# Patient Record
Sex: Female | Born: 1990 | Race: Black or African American | Hispanic: No | Marital: Single | Smoking: Never smoker
Health system: Southern US, Community
[De-identification: ages and names within clinical notes are randomized; demographics above are authoritative.]

## PROBLEM LIST (undated history)

## (undated) DIAGNOSIS — F419 Anxiety disorder, unspecified: Secondary | ICD-10-CM

## (undated) DIAGNOSIS — E039 Hypothyroidism, unspecified: Secondary | ICD-10-CM

## (undated) DIAGNOSIS — Z789 Other specified health status: Secondary | ICD-10-CM

## (undated) DIAGNOSIS — Z9081 Acquired absence of spleen: Secondary | ICD-10-CM

## (undated) DIAGNOSIS — F32A Depression, unspecified: Secondary | ICD-10-CM

## (undated) HISTORY — PX: SPLENECTOMY: SUR1306

---

## 2015-09-08 ENCOUNTER — Ambulatory Visit (INDEPENDENT_AMBULATORY_CARE_PROVIDER_SITE_OTHER): Payer: BC Managed Care – PPO | Admitting: *Deleted

## 2015-09-08 DIAGNOSIS — Z3201 Encounter for pregnancy test, result positive: Secondary | ICD-10-CM

## 2015-09-08 NOTE — Progress Notes (Signed)
Pt in for pregnancy test. Result is positive. Patient has certain lmp of 08/06/15. She desires to establish care with us. Prenatal labs drawn and first trimester screen scheduled.

## 2015-09-09 LAB — PRENATAL PROFILE (SOLSTAS)
Antibody Screen: NEGATIVE
BASOS PCT: 0 % (ref 0–1)
Basophils Absolute: 0 10*3/uL (ref 0.0–0.1)
EOS ABS: 0.1 10*3/uL (ref 0.0–0.7)
EOS PCT: 1 % (ref 0–5)
HCT: 36 % (ref 36.0–46.0)
HEMOGLOBIN: 12.5 g/dL (ref 12.0–15.0)
HEP B S AG: NEGATIVE
HIV 1&2 Ab, 4th Generation: NONREACTIVE
Lymphocytes Relative: 24 % (ref 12–46)
Lymphs Abs: 2.3 10*3/uL (ref 0.7–4.0)
MCH: 30.3 pg (ref 26.0–34.0)
MCHC: 34.7 g/dL (ref 30.0–36.0)
MCV: 87.4 fL (ref 78.0–100.0)
MONO ABS: 0.6 10*3/uL (ref 0.1–1.0)
MONOS PCT: 6 % (ref 3–12)
MPV: 9.7 fL (ref 8.6–12.4)
NEUTROS ABS: 6.6 10*3/uL (ref 1.7–7.7)
Neutrophils Relative %: 69 % (ref 43–77)
Platelets: 518 10*3/uL — ABNORMAL HIGH (ref 150–400)
RBC: 4.12 MIL/uL (ref 3.87–5.11)
RDW: 14.7 % (ref 11.5–15.5)
RH TYPE: POSITIVE
RUBELLA: 6.34 {index} — AB (ref <0.90)
WBC: 9.5 10*3/uL (ref 4.0–10.5)

## 2015-09-09 LAB — PRESCRIPTION MONITORING PROFILE (19 PANEL)
Amphetamine/Meth: NEGATIVE ng/mL
BARBITURATE SCREEN, URINE: NEGATIVE ng/mL
BENZODIAZEPINE SCREEN, URINE: NEGATIVE ng/mL
BUPRENORPHINE, URINE: NEGATIVE ng/mL
COCAINE METABOLITES: NEGATIVE ng/mL
CREATININE, URINE: 162.84 mg/dL (ref >20.0)
Cannabinoid Scrn, Ur: NEGATIVE ng/mL
Carisoprodol, Urine: NEGATIVE ng/mL
ECSTASY: NEGATIVE ng/mL
FENTANYL URINE: NEGATIVE ng/mL
METHAQUALONE SCREEN (URINE): NEGATIVE ng/mL
Meperidine, Ur: NEGATIVE ng/mL
Methadone Screen, Urine: NEGATIVE ng/mL
Nitrites, Initial: NEGATIVE ug/mL
OPIATE SCREEN, URINE: NEGATIVE ng/mL
Oxycodone Screen, Ur: NEGATIVE ng/mL
PH URINE, INITIAL: 6.8 pH (ref 4.5–8.9)
PHENCYCLIDINE, UR: NEGATIVE ng/mL
Propoxyphene: NEGATIVE ng/mL
Tapentadol, urine: NEGATIVE ng/mL
Tramadol Scrn, Ur: NEGATIVE ng/mL
ZOLPIDEM, URINE: NEGATIVE ng/mL

## 2015-09-09 LAB — CULTURE, OB URINE
COLONY COUNT: NO GROWTH
ORGANISM ID, BACTERIA: NO GROWTH

## 2015-10-26 ENCOUNTER — Encounter (HOSPITAL_COMMUNITY): Payer: Self-pay | Admitting: Family Medicine

## 2015-11-02 ENCOUNTER — Ambulatory Visit (HOSPITAL_COMMUNITY)
Admission: RE | Admit: 2015-11-02 | Discharge: 2015-11-02 | Disposition: A | Discharge status: Home / Self Care | Payer: BC Managed Care – PPO | Source: Ambulatory Visit | Attending: Family Medicine | Admitting: Family Medicine

## 2015-11-02 ENCOUNTER — Other Ambulatory Visit: Payer: Self-pay | Admitting: Family Medicine

## 2015-11-02 ENCOUNTER — Encounter (HOSPITAL_COMMUNITY): Payer: Self-pay

## 2015-11-02 ENCOUNTER — Ambulatory Visit (INDEPENDENT_AMBULATORY_CARE_PROVIDER_SITE_OTHER): Payer: BC Managed Care – PPO | Admitting: Advanced Practice Midwife

## 2015-11-02 ENCOUNTER — Encounter: Payer: Self-pay | Admitting: Family Medicine

## 2015-11-02 VITALS — BP 124/76 | HR 89 | Temp 98.4°F | Ht 63.5 in | Wt 273.7 lb

## 2015-11-02 VITALS — BP 119/58 | HR 91 | Wt 274.0 lb

## 2015-11-02 DIAGNOSIS — Z124 Encounter for screening for malignant neoplasm of cervix: Secondary | ICD-10-CM | POA: Diagnosis not present

## 2015-11-02 DIAGNOSIS — Z113 Encounter for screening for infections with a predominantly sexual mode of transmission: Secondary | ICD-10-CM

## 2015-11-02 DIAGNOSIS — E669 Obesity, unspecified: Secondary | ICD-10-CM | POA: Insufficient documentation

## 2015-11-02 DIAGNOSIS — O99211 Obesity complicating pregnancy, first trimester: Secondary | ICD-10-CM

## 2015-11-02 DIAGNOSIS — Z369 Encounter for antenatal screening, unspecified: Secondary | ICD-10-CM

## 2015-11-02 DIAGNOSIS — Z3A12 12 weeks gestation of pregnancy: Secondary | ICD-10-CM | POA: Insufficient documentation

## 2015-11-02 DIAGNOSIS — Z3401 Encounter for supervision of normal first pregnancy, first trimester: Secondary | ICD-10-CM

## 2015-11-02 DIAGNOSIS — Z3402 Encounter for supervision of normal first pregnancy, second trimester: Secondary | ICD-10-CM | POA: Diagnosis not present

## 2015-11-02 DIAGNOSIS — Z3201 Encounter for pregnancy test, result positive: Secondary | ICD-10-CM

## 2015-11-02 DIAGNOSIS — Z3492 Encounter for supervision of normal pregnancy, unspecified, second trimester: Secondary | ICD-10-CM

## 2015-11-02 DIAGNOSIS — Z3490 Encounter for supervision of normal pregnancy, unspecified, unspecified trimester: Secondary | ICD-10-CM

## 2015-11-02 DIAGNOSIS — Z36 Encounter for antenatal screening of mother: Secondary | ICD-10-CM | POA: Diagnosis present

## 2015-11-02 DIAGNOSIS — N390 Urinary tract infection, site not specified: Secondary | ICD-10-CM | POA: Insufficient documentation

## 2015-11-02 HISTORY — DX: Other specified health status: Z78.9

## 2015-11-02 LAB — POCT URINALYSIS DIP (DEVICE)
BILIRUBIN URINE: NEGATIVE
GLUCOSE, UA: NEGATIVE mg/dL
HGB URINE DIPSTICK: NEGATIVE
Ketones, ur: NEGATIVE mg/dL
NITRITE: POSITIVE — AB
Protein, ur: NEGATIVE mg/dL
Specific Gravity, Urine: <=1.005 (ref 1.005–1.030)
UROBILINOGEN UA: 0.2 mg/dL (ref 0.0–1.0)
pH: 5.5 (ref 5.0–8.0)

## 2015-11-02 NOTE — Patient Instructions (Signed)
First Trimester of Pregnancy The first trimester of pregnancy is from week 1 until the end of week 12 (months 1 through 3). A week after a sperm fertilizes an egg, the egg will implant on the wall of the uterus. This embryo will begin to develop into a baby. Genes from you and your partner are forming the baby. The female genes determine whether the baby is a boy or a girl. At 6-8 weeks, the eyes and face are formed, and the heartbeat can be seen on ultrasound. At the end of 12 weeks, all the baby's organs are formed.  Now that you are pregnant, you will want to do everything you can to have a healthy baby. Two of the most important things are to get good prenatal care and to follow your health care provider's instructions. Prenatal care is all the medical care you receive before the baby's birth. This care will help prevent, find, and treat any problems during the pregnancy and childbirth. BODY CHANGES Your body goes through many changes during pregnancy. The changes vary from woman to woman.   You may gain or lose a couple of pounds at first.  You may feel sick to your stomach (nauseous) and throw up (vomit). If the vomiting is uncontrollable, call your health care provider.  You may tire easily.  You may develop headaches that can be relieved by medicines approved by your health care provider.  You may urinate more often. Painful urination may mean you have a bladder infection.  You may develop heartburn as a result of your pregnancy.  You may develop constipation because certain hormones are causing the muscles that push waste through your intestines to slow down.  You may develop hemorrhoids or swollen, bulging veins (varicose veins).  Your breasts may begin to grow larger and become tender. Your nipples may stick out more, and the tissue that surrounds them (areola) may become darker.  Your gums may bleed and may be sensitive to brushing and flossing.  Dark spots or blotches (chloasma,  mask of pregnancy) may develop on your face. This will likely fade after the baby is born.  Your menstrual periods will stop.  You may have a loss of appetite.  You may develop cravings for certain kinds of food.  You may have changes in your emotions from day to day, such as being excited to be pregnant or being concerned that something may go wrong with the pregnancy and baby.  You may have more vivid and strange dreams.  You may have changes in your hair. These can include thickening of your hair, rapid growth, and changes in texture. Some women also have hair loss during or after pregnancy, or hair that feels dry or thin. Your hair will most likely return to normal after your baby is born. WHAT TO EXPECT AT YOUR PRENATAL VISITS During a routine prenatal visit:  You will be weighed to make sure you and the baby are growing normally.  Your blood pressure will be taken.  Your abdomen will be measured to track your baby's growth.  The fetal heartbeat will be listened to starting around week 10 or 12 of your pregnancy.  Test results from any previous visits will be discussed. Your health care provider may ask you:  How you are feeling.  If you are feeling the baby move.  If you have had any abnormal symptoms, such as leaking fluid, bleeding, severe headaches, or abdominal cramping.  If you are using any tobacco products,   including cigarettes, chewing tobacco, and electronic cigarettes.  If you have any questions. Other tests that may be performed during your first trimester include:  Blood tests to find your blood type and to check for the presence of any previous infections. They will also be used to check for low iron levels (anemia) and Rh antibodies. Later in the pregnancy, blood tests for diabetes will be done along with other tests if problems develop.  Urine tests to check for infections, diabetes, or protein in the urine.  An ultrasound to confirm the proper growth  and development of the baby.  An amniocentesis to check for possible genetic problems.  Fetal screens for spina bifida and Down syndrome.  You may need other tests to make sure you and the baby are doing well.  HIV (human immunodeficiency virus) testing. Routine prenatal testing includes screening for HIV, unless you choose not to have this test. HOME CARE INSTRUCTIONS  Medicines  Follow your health care provider's instructions regarding medicine use. Specific medicines may be either safe or unsafe to take during pregnancy.  Take your prenatal vitamins as directed.  If you develop constipation, try taking a stool softener if your health care provider approves. Diet  Eat regular, well-balanced meals. Choose a variety of foods, such as meat or vegetable-based protein, fish, milk and low-fat dairy products, vegetables, fruits, and whole grain breads and cereals. Your health care provider will help you determine the amount of weight gain that is right for you.  Avoid raw meat and uncooked cheese. These carry germs that can cause birth defects in the baby.  Eating four or five small meals rather than three large meals a day may help relieve nausea and vomiting. If you start to feel nauseous, eating a few soda crackers can be helpful. Drinking liquids between meals instead of during meals also seems to help nausea and vomiting.  If you develop constipation, eat more high-fiber foods, such as fresh vegetables or fruit and whole grains. Drink enough fluids to keep your urine clear or pale yellow. Activity and Exercise  Exercise only as directed by your health care provider. Exercising will help you:  Control your weight.  Stay in shape.  Be prepared for labor and delivery.  Experiencing pain or cramping in the lower abdomen or low back is a good sign that you should stop exercising. Check with your health care provider before continuing normal exercises.  Try to avoid standing for long  periods of time. Move your legs often if you must stand in one place for a long time.  Avoid heavy lifting.  Wear low-heeled shoes, and practice good posture.  You may continue to have sex unless your health care provider directs you otherwise. Relief of Pain or Discomfort  Wear a good support bra for breast tenderness.   Take warm sitz baths to soothe any pain or discomfort caused by hemorrhoids. Use hemorrhoid cream if your health care provider approves.   Rest with your legs elevated if you have leg cramps or low back pain.  If you develop varicose veins in your legs, wear support hose. Elevate your feet for 15 minutes, 3-4 times a day. Limit salt in your diet. Prenatal Care  Schedule your prenatal visits by the twelfth week of pregnancy. They are usually scheduled monthly at first, then more often in the last 2 months before delivery.  Write down your questions. Take them to your prenatal visits.  Keep all your prenatal visits as directed by your   health care provider. Safety  Wear your seat belt at all times when driving.  Make a list of emergency phone numbers, including numbers for family, friends, the hospital, and police and fire departments. General Tips  Ask your health care provider for a referral to a local prenatal education class. Begin classes no later than at the beginning of month 6 of your pregnancy.  Ask for help if you have counseling or nutritional needs during pregnancy. Your health care provider can offer advice or refer you to specialists for help with various needs.  Do not use hot tubs, steam rooms, or saunas.  Do not douche or use tampons or scented sanitary pads.  Do not cross your legs for long periods of time.  Avoid cat litter boxes and soil used by cats. These carry germs that can cause birth defects in the baby and possibly loss of the fetus by miscarriage or stillbirth.  Avoid all smoking, herbs, alcohol, and medicines not prescribed by  your health care provider. Chemicals in these affect the formation and growth of the baby.  Do not use any tobacco products, including cigarettes, chewing tobacco, and electronic cigarettes. If you need help quitting, ask your health care provider. You may receive counseling support and other resources to help you quit.  Schedule a dentist appointment. At home, brush your teeth with a soft toothbrush and be gentle when you floss. SEEK MEDICAL CARE IF:   You have dizziness.  You have mild pelvic cramps, pelvic pressure, or nagging pain in the abdominal area.  You have persistent nausea, vomiting, or diarrhea.  You have a bad smelling vaginal discharge.  You have pain with urination.  You notice increased swelling in your face, hands, legs, or ankles. SEEK IMMEDIATE MEDICAL CARE IF:   You have a fever.  You are leaking fluid from your vagina.  You have spotting or bleeding from your vagina.  You have severe abdominal cramping or pain.  You have rapid weight gain or loss.  You vomit blood or material that looks like coffee grounds.  You are exposed to German measles and have never had them.  You are exposed to fifth disease or chickenpox.  You develop a severe headache.  You have shortness of breath.  You have any kind of trauma, such as from a fall or a car accident.   This information is not intended to replace advice given to you by your health care provider. Make sure you discuss any questions you have with your health care provider.   Document Released: 08/14/2001 Document Revised: 09/10/2014 Document Reviewed: 06/30/2013 Elsevier Interactive Patient Education 2016 [employer] Inc[employer]  

## 2015-11-02 NOTE — Progress Notes (Signed)
Pain- hip pain  New ob packet given First Trimester Screen scheduled for 11/03/15 Early glucola will be scheduled for 11/04/15 @ 0800 due to pt allergic to orange and will need to do jelly bean test UA results + nitrate

## 2015-11-02 NOTE — Progress Notes (Signed)
New OB, See SmartSet  Subjective:    Joann Pearson is a G1P0 [redacted]w[redacted]d being seen today for her first obstetrical visit.  Her obstetrical history is significant for obesity. Patient does intend to breast feed. Pregnancy history fully reviewed.  Patient reports no complaints.  Filed Vitals:   11/02/15 0938 11/02/15 0940  BP: 124/76   Pulse: 89   Temp: 98.4 F (36.9 C)   Height:  5' 3.5" (1.613 m)  Weight: 273 lb 11.2 oz (124.15 kg)     HISTORY: OB History  Gravida Para Term Preterm AB SAB TAB Ectopic Multiple Living  1             # Outcome Date GA Lbr Len/2nd Weight Sex Delivery Anes PTL Lv  1 Current              History reviewed. No pertinent past medical history. History reviewed. No pertinent past surgical history. History reviewed. No pertinent family history.   Exam    Uterus:  Fundal Height: 12 cm  Pelvic Exam:    Perineum: No Hemorrhoids, Normal Perineum   Vulva: Bartholin's, Urethra, Skene's normal   Vagina:  normal mucosa, normal discharge   pH:    Cervix: no bleeding following Pap, no cervical motion tenderness and Diffucult to visualize, unsure if I got into endocervix   Adnexa: normal adnexa and no mass, fullness, tenderness   Bony Pelvis: gynecoid  System: Breast:  normal appearance, no masses or tenderness   Skin: normal coloration and turgor, no rashes    Neurologic: oriented, grossly non-focal   Extremities: normal strength, tone, and muscle mass   HEENT neck supple with midline trachea   Mouth/Teeth mucous membranes moist, pharynx normal without lesions   Neck supple and no masses   Cardiovascular: regular rate and rhythm, no murmurs or gallops   Respiratory:  appears well, vitals normal, no respiratory distress, acyanotic, normal RR, ear and throat exam is normal, neck free of mass or lymphadenopathy, chest clear, no wheezing, crepitations, rhonchi, normal symmetric air entry   Abdomen: soft, non-tender; bowel sounds normal; no masses,  no  organomegaly   Urinary: urethral meatus normal      Assessment:    Pregnancy: G1P0 Patient Active Problem List   Diagnosis Date Noted  . UTI (lower urinary tract infection) 11/02/2015  . Supervision of normal pregnancy 11/02/2015  . Obesity 11/02/2015        Plan:     Initial labs drawn. Prenatal vitamins. Problem list reviewed and updated. Genetic Screening discussed First Screen: ordered.  Ultrasound discussed; fetal survey: ordered.  Follow up in 4 weeks. 50% of 30 min visit spent on counseling and coordination of care.   Routines reviewed. Welcomed to practice, reviewed expected timeline and MAU/ER facilities.   [facility] 11/02/2015

## 2015-11-03 LAB — GC/CHLAMYDIA PROBE AMP (~~LOC~~) NOT AT ARMC
Chlamydia: NEGATIVE
Neisseria Gonorrhea: NEGATIVE

## 2015-11-03 LAB — CYTOLOGY - PAP

## 2015-11-04 ENCOUNTER — Other Ambulatory Visit: Payer: BC Managed Care – PPO

## 2015-11-04 DIAGNOSIS — Z3490 Encounter for supervision of normal pregnancy, unspecified, unspecified trimester: Secondary | ICD-10-CM

## 2015-11-04 LAB — CBC
HEMATOCRIT: 34.6 % — AB (ref 36.0–46.0)
Hemoglobin: 12 g/dL (ref 12.0–15.0)
MCH: 31.1 pg (ref 26.0–34.0)
MCHC: 34.7 g/dL (ref 30.0–36.0)
MCV: 89.6 fL (ref 78.0–100.0)
MPV: 10 fL (ref 8.6–12.4)
Platelets: 539 10*3/uL — ABNORMAL HIGH (ref 150–400)
RBC: 3.86 MIL/uL — AB (ref 3.87–5.11)
RDW: 13.7 % (ref 11.5–15.5)
WBC: 8.5 10*3/uL (ref 4.0–10.5)

## 2015-11-04 LAB — HIV ANTIBODY (ROUTINE TESTING W REFLEX): HIV 1&2 Ab, 4th Generation: NONREACTIVE

## 2015-11-04 LAB — CULTURE, OB URINE
COLONY COUNT: NO GROWTH
Organism ID, Bacteria: NO GROWTH

## 2015-11-05 LAB — GLUCOSE TOLERANCE, 1 HOUR (50G) W/O FASTING: Glucose, 1 Hr, gestational: 87 mg/dL (ref <140)

## 2015-11-05 LAB — RPR

## 2015-11-09 ENCOUNTER — Encounter: Payer: Self-pay | Admitting: *Deleted

## 2015-11-09 DIAGNOSIS — Z3490 Encounter for supervision of normal pregnancy, unspecified, unspecified trimester: Secondary | ICD-10-CM

## 2015-11-10 ENCOUNTER — Other Ambulatory Visit: Payer: BC Managed Care – PPO

## 2015-11-11 ENCOUNTER — Other Ambulatory Visit (HOSPITAL_COMMUNITY): Payer: Self-pay

## 2015-11-30 ENCOUNTER — Ambulatory Visit (INDEPENDENT_AMBULATORY_CARE_PROVIDER_SITE_OTHER): Payer: BC Managed Care – PPO | Admitting: Family

## 2015-11-30 VITALS — BP 134/72 | HR 92 | Temp 98.3°F | Wt 271.2 lb

## 2015-11-30 DIAGNOSIS — N390 Urinary tract infection, site not specified: Secondary | ICD-10-CM

## 2015-11-30 DIAGNOSIS — Z1389 Encounter for screening for other disorder: Secondary | ICD-10-CM

## 2015-11-30 DIAGNOSIS — Z3491 Encounter for supervision of normal pregnancy, unspecified, first trimester: Secondary | ICD-10-CM

## 2015-11-30 DIAGNOSIS — E669 Obesity, unspecified: Secondary | ICD-10-CM

## 2015-11-30 DIAGNOSIS — O99211 Obesity complicating pregnancy, first trimester: Secondary | ICD-10-CM

## 2015-11-30 DIAGNOSIS — O2341 Unspecified infection of urinary tract in pregnancy, first trimester: Secondary | ICD-10-CM

## 2015-11-30 LAB — POCT URINALYSIS DIP (DEVICE)
BILIRUBIN URINE: NEGATIVE
GLUCOSE, UA: NEGATIVE mg/dL
HGB URINE DIPSTICK: NEGATIVE
KETONES UR: NEGATIVE mg/dL
Nitrite: NEGATIVE
Protein, ur: NEGATIVE mg/dL
SPECIFIC GRAVITY, URINE: 1.02 (ref 1.005–1.030)
Urobilinogen, UA: 0.2 mg/dL (ref 0.0–1.0)
pH: 7.5 (ref 5.0–8.0)

## 2015-11-30 NOTE — Progress Notes (Signed)
Educated pt on Skin to Skin  

## 2015-11-30 NOTE — Progress Notes (Signed)
Subjective:  Joann Pearson is a 25 y.o. G1P0 at 8720w4d being seen today for ongoing prenatal care.  She is currently monitored for the following issues for this low-risk pregnancy and has supervision of normal pregnancy; and Obesity on her problem list.  Patient reports no complaints.  Contractions: Not present. Vag. Bleeding: None.  Movement: Present. Denies leaking of fluid.   The following portions of the patient's history were reviewed and updated as appropriate: allergies, current medications, past family history, past medical history, past social history, past surgical history and problem list. Problem list updated.  Objective:   Filed Vitals:   11/30/15 0907  BP: 134/72  Pulse: 92  Temp: 98.3 F (36.8 C)  Weight: 271 lb 3.2 oz (123.016 kg)    Fetal Status: Fetal Heart Rate (bpm): 147 Fundal Height: 18 cm Movement: Present     General:  Alert, oriented and cooperative. Patient is in no acute distress.  Skin: Skin is warm and dry. No rash noted.   Cardiovascular: Normal heart rate noted  Respiratory: Normal respiratory effort, no problems with respiration noted  Abdomen: Soft, gravid, appropriate for gestational age. Pain/Pressure: Present     Pelvic: Vag. Bleeding: None     Cervical exam deferred        Extremities: Normal range of motion.  Edema: None  Mental Status: Normal mood and affect. Normal behavior. Normal judgment and thought content.   Urinalysis: Urine Protein: Negative Urine Glucose: Negative  Assessment and Plan:  Pregnancy: G1P0 at 6420w4d  1. Supervision of normal pregnancy, first trimester - Alpha fetoprotein, maternal - US MFM OB DETAIL +14 WK; Future  2. Encounter for routine screening for malformation using ultrasonics - US MFM OB DETAIL +14 WK; Future  General obstetric precautions including but not limited to vaginal bleeding and pelvic pain reviewed in detail with the patient. Please refer to After Visit Summary for other counseling recommendations.   Return in about 4 weeks (around 12/28/2015).   Eino FarberWalidah Kennith GainN Karim, CNM

## 2015-11-30 NOTE — Patient Instructions (Signed)
AREA PEDIATRIC/FAMILY PRACTICE PHYSICIANS  [facility] [address] [place], [place] [address] Phone - [phone]   Fax - [phone]  [facility] [address] [place], [place]  [address] Phone - [phone]   Fax - [phone]  [facility] [address] [place], [place]  [address] Phone - [phone]   Fax - [phone]   [facility] [address] [place], [place]  [address] Phone - [phone]   Fax - [phone]  [facility] [address] [place], [place]  [address] Phone - [phone]   Fax - [phone]  [facility] [address] [place], [place]  [address] Phone - [phone]   Fax - [phone]  [facility] [address] [place], [place]  [address] Phone - [phone]   Fax - [phone]  [facility] [address] [place], [place]  [address] Phone - [phone]   Fax - [phone]  [facility] [address] [place], [place]  [address] Phone - [phone]   Fax - [phone] [facility] [address] [place], [place]  [address] Phone - [phone]   Fax - [phone]  [facility] [address] [place], [place]  [address] Phone - [phone]   Fax - [phone]  [facility] [address] [place], [place]  [address] Phone - [phone]   Fax - [phone]  [facility] [address] [place], [place]  [address] Phone - [phone]   Fax - [phone]  [facility]IL[facility] GOSRANI [address] [place], [place]  [address] Phone - [phone]  [facility] [address] [place], [place]  [address] Phone - [phone]   Fax - [phone]  [facility] [address] [place], [place]  [address] Phone - [phone]   Fax - [phone]  [facility] [address] [place], [place]  [address] Phone - [phone]   Fax - [phone]  [facility] [address] [place], [place]  [address] Phone - [phone]   Fax - [phone]   [facility] [address] [place], [place]  [address] Phone - [phone]   Fax - [phone]  [facility] [address] [place], [place]  [address] Phone - [phone]   Fax - [phone]  DAV[facility] RUBIN [address] [place], [place]  [address] Phone - [phone]   Fax - [phone]  [facility] [address] , [place]  [address] Phone - [phone]   Fax - [phone]  [facility] [address] , [place]  [address] Phone - [phone]   Fax - [phone] [facility] [address] [place], [place]  [address] Phone - [phone]   Fax - [phone]  [facility] [address] [place], [place]  [address] Phone - [phone]   Fax - [phone]  [facility] [address] [place],   [address] Phone - [phone]   Fax - [phone]   

## 2015-11-30 NOTE — Addendum Note (Signed)
Addended by: Aldona LentoFISHER, Paul Trettin L on: 11/30/2015 09:43 AM   Modules accepted: Orders

## 2015-12-02 LAB — ALPHA FETOPROTEIN, MATERNAL
AFP: 22.8 ng/mL
CURR GEST AGE: 16.4 wks.days
MoM for AFP: 0.87
Open Spina bifida: NEGATIVE
Osb Risk: 1:41800 {titer}

## 2015-12-14 ENCOUNTER — Ambulatory Visit (HOSPITAL_COMMUNITY)
Admission: RE | Admit: 2015-12-14 | Discharge: 2015-12-14 | Disposition: A | Discharge status: Home / Self Care | Payer: BC Managed Care – PPO | Source: Ambulatory Visit | Attending: Family | Admitting: Family

## 2015-12-14 DIAGNOSIS — Z1389 Encounter for screening for other disorder: Secondary | ICD-10-CM

## 2015-12-14 DIAGNOSIS — Z36 Encounter for antenatal screening of mother: Secondary | ICD-10-CM | POA: Diagnosis not present

## 2015-12-14 DIAGNOSIS — Z3A18 18 weeks gestation of pregnancy: Secondary | ICD-10-CM | POA: Insufficient documentation

## 2015-12-14 DIAGNOSIS — O99212 Obesity complicating pregnancy, second trimester: Secondary | ICD-10-CM | POA: Diagnosis present

## 2015-12-14 DIAGNOSIS — Z3491 Encounter for supervision of normal pregnancy, unspecified, first trimester: Secondary | ICD-10-CM

## 2015-12-28 ENCOUNTER — Ambulatory Visit (INDEPENDENT_AMBULATORY_CARE_PROVIDER_SITE_OTHER): Payer: BC Managed Care – PPO | Admitting: Family

## 2015-12-28 VITALS — BP 121/68 | HR 88 | Temp 98.2°F | Wt 272.2 lb

## 2015-12-28 DIAGNOSIS — Z3492 Encounter for supervision of normal pregnancy, unspecified, second trimester: Secondary | ICD-10-CM

## 2015-12-28 DIAGNOSIS — Z3482 Encounter for supervision of other normal pregnancy, second trimester: Secondary | ICD-10-CM | POA: Diagnosis not present

## 2015-12-28 LAB — POCT URINALYSIS DIP (DEVICE)
BILIRUBIN URINE: NEGATIVE
Glucose, UA: NEGATIVE mg/dL
Hgb urine dipstick: NEGATIVE
Ketones, ur: NEGATIVE mg/dL
NITRITE: NEGATIVE
PH: 7 (ref 5.0–8.0)
Protein, ur: NEGATIVE mg/dL
Specific Gravity, Urine: 1.015 (ref 1.005–1.030)
UROBILINOGEN UA: 0.2 mg/dL (ref 0.0–1.0)

## 2015-12-28 NOTE — Progress Notes (Signed)
Subjective:  Joann CahillKayla Densmore is a 25 y.o. G1P0 at 711w4d being seen today for ongoing prenatal care.  She is currently monitored for the following issues for this low-risk pregnancy and has UTI (lower urinary tract infection); Supervision of normal pregnancy; and Obesity on her problem list.  Patient reports no complaints.   .  .  Movement: Present. Denies leaking of fluid.   The following portions of the patient's history were reviewed and updated as appropriate: allergies, current medications, past family history, past medical history, past social history, past surgical history and problem list. Problem list updated.  Objective:   Filed Vitals:   12/28/15 0947  BP: 121/68  Pulse: 88  Temp: 98.2 F (36.8 C)  Weight: 272 lb 3.2 oz (123.469 kg)    Fetal Status: Fetal Heart Rate (bpm): 147 Fundal Height: 22 cm Movement: Present     General:  Alert, oriented and cooperative. Patient is in no acute distress.  Skin: Skin is warm and dry. No rash noted.   Cardiovascular: Normal heart rate noted  Respiratory: Normal respiratory effort, no problems with respiration noted  Abdomen: Soft, gravid, appropriate for gestational age. Pain/Pressure: Present     Pelvic:       Cervical exam deferred        Extremities: Normal range of motion.  Edema: None  Mental Status: Normal mood and affect. Normal behavior. Normal judgment and thought content.   Urinalysis: Urine Protein: Negative Urine Glucose: Negative  Assessment and Plan:  Pregnancy: G1P0 at 1001w4d  1. Supervision of normal pregnancy, antepartum, second trimester - US MFM OB FOLLOW UP; Future > limited views  2. Supervision of normal pregnancy, second trimester - Reviewed anatomy ultrasound  Preterm labor symptoms and general obstetric precautions including but not limited to vaginal bleeding, contractions, leaking of fluid and fetal movement were reviewed in detail with the patient. Please refer to After Visit Summary for other counseling  recommendations.  Return in about 4 weeks (around 01/25/2016).   Eino FarberWalidah Kennith GainN Karim, CNM

## 2015-12-28 NOTE — Progress Notes (Signed)
OB f/u US May 10th @ 1500.  Pt notified.

## 2015-12-28 NOTE — Patient Instructions (Signed)
Please call [phone] or [phone] to schedule childbirth education classes or visit [email or web address] for a description of the classes. 

## 2016-01-04 NOTE — Progress Notes (Signed)
Home Medicaid Form Completed  

## 2016-01-11 ENCOUNTER — Ambulatory Visit (HOSPITAL_COMMUNITY)
Admission: RE | Admit: 2016-01-11 | Discharge: 2016-01-11 | Disposition: A | Discharge status: Home / Self Care | Payer: BC Managed Care – PPO | Source: Ambulatory Visit | Attending: Family | Admitting: Family

## 2016-01-11 ENCOUNTER — Other Ambulatory Visit: Payer: Self-pay | Admitting: Family

## 2016-01-11 DIAGNOSIS — O99212 Obesity complicating pregnancy, second trimester: Secondary | ICD-10-CM | POA: Diagnosis not present

## 2016-01-11 DIAGNOSIS — Z3482 Encounter for supervision of other normal pregnancy, second trimester: Secondary | ICD-10-CM | POA: Diagnosis present

## 2016-01-11 DIAGNOSIS — IMO0002 Reserved for concepts with insufficient information to code with codable children: Secondary | ICD-10-CM

## 2016-01-11 DIAGNOSIS — Z3A22 22 weeks gestation of pregnancy: Secondary | ICD-10-CM | POA: Diagnosis not present

## 2016-01-11 DIAGNOSIS — Z3492 Encounter for supervision of normal pregnancy, unspecified, second trimester: Secondary | ICD-10-CM

## 2016-01-11 DIAGNOSIS — Z0489 Encounter for examination and observation for other specified reasons: Secondary | ICD-10-CM

## 2016-01-25 ENCOUNTER — Ambulatory Visit (INDEPENDENT_AMBULATORY_CARE_PROVIDER_SITE_OTHER): Payer: BC Managed Care – PPO | Admitting: Certified Nurse Midwife

## 2016-01-25 VITALS — BP 119/47 | HR 97 | Wt 270.0 lb

## 2016-01-25 DIAGNOSIS — E669 Obesity, unspecified: Secondary | ICD-10-CM

## 2016-01-25 DIAGNOSIS — Z3492 Encounter for supervision of normal pregnancy, unspecified, second trimester: Secondary | ICD-10-CM | POA: Diagnosis not present

## 2016-01-25 DIAGNOSIS — O99212 Obesity complicating pregnancy, second trimester: Secondary | ICD-10-CM

## 2016-01-25 LAB — POCT URINALYSIS DIP (DEVICE)
Bilirubin Urine: NEGATIVE
Glucose, UA: NEGATIVE mg/dL
Hgb urine dipstick: NEGATIVE
Ketones, ur: NEGATIVE mg/dL
Nitrite: NEGATIVE
Protein, ur: NEGATIVE mg/dL
Specific Gravity, Urine: 1.015 (ref 1.005–1.030)
Urobilinogen, UA: 0.2 mg/dL (ref 0.0–1.0)
pH: 7 (ref 5.0–8.0)

## 2016-01-25 NOTE — Patient Instructions (Signed)
Glucose Tolerance Test During Pregnancy The glucose tolerance test (GTT) is a blood test used to determine if you have developed a type of diabetes during pregnancy (gestational diabetes). This is when your body does not properly process sugar (glucose) in the food you eat, resulting in high blood glucose levels. Typically, a GTT is done after you have had a 1-hour glucose test with results that indicate you possibly have gestational diabetes. It may also be done if:  You have a history of giving birth to very large babies or have experienced repeated fetal loss (stillbirth).   You have signs and symptoms of diabetes, such as:   Changes in your vision.   Tingling or numbness in your hands or feet.   Changes in hunger, thirst, and urination not otherwise explained by your pregnancy.  The GTT lasts about 3 hours. You will be given a sugar-water solution to drink at the beginning of the test. You will have blood drawn before you drink the solution and then again 1, 2, and 3 hours after you drink it. You will not be allowed to eat or drink anything else during the test. You must remain at the testing location to make sure that your blood is drawn on time. You should also avoid exercising during the test, because exercise can alter test results. PREPARATION FOR TEST  Eat normally for 3 days prior to the GTT test, including having plenty of carbohydrate-rich foods. Do not eat or drink anything except water during the final 12 hours before the test. In addition, your health care provider may ask you to stop taking certain medicines before the test. RESULTS  It is your responsibility to obtain your test results. Ask the lab or department performing the test when and how you will get your results. Contact your health care provider to discuss any questions you have about your results.  Range of Normal Values Ranges for normal values may vary among different labs and hospitals. You should always check  with your health care provider after having lab work or other tests done to discuss whether your values are considered within normal limits. Normal levels of blood glucose are as follows:  Fasting: less than 105 mg/dL.   1 hour after drinking the solution: less than 190 mg/dL.   2 hours after drinking the solution: less than 165 mg/dL.   3 hours after drinking the solution: less than 145 mg/dL.  Some substances can interfere with GTT results. These may include:  Blood pressure and heart failure medicines, including beta blockers, furosemide, and thiazides.   Anti-inflammatory medicines, including aspirin.   Nicotine.   Some psychiatric medicines.  Meaning of Results Outside Normal Value Ranges GTT test results that are above normal values may indicate a number of health problems, such as:   Gestational diabetes.   Acute stress response.   Cushing syndrome.   Tumors such as pheochromocytoma or glucagonoma.   Long-term kidney problems.   Pancreatitis.   Hyperthyroidism.   Current infection.  Discuss your test results with your health care provider. He or she will use the results to make a diagnosis and determine a treatment plan that is right for you.   This information is not intended to replace advice given to you by your health care provider. Make sure you discuss any questions you have with your health care provider.   Document Released: 02/19/2012 Document Revised: 09/10/2014 Document Reviewed: 12/25/2013 Elsevier Interactive Patient Education 2016 Elsevier Inc.  

## 2016-01-25 NOTE — Progress Notes (Signed)
Using jellybeans for 1 hour GTT next visit- Subjective:  Joann Pearson is a 25 y.o. G1P0 at 2118w4d being seen today for ongoing prenatal care.  She is currently monitored for the following issues for this low-risk pregnancy and has UTI (lower urinary tract infection); Supervision of normal pregnancy; and Obesity on her problem list.  Patient reports no complaints.   .  .  Movement: Present. Denies leaking of fluid.   The following portions of the patient's history were reviewed and updated as appropriate: allergies, current medications, past family history, past medical history, past social history, past surgical history and problem list. Problem list updated.  Objective:   Filed Vitals:   01/25/16 0959  BP: 119/47  Pulse: 97  Weight: 270 lb (122.471 kg)    Fetal Status: Fetal Heart Rate (bpm): 135   Movement: Present     General:  Alert, oriented and cooperative. Patient is in no acute distress.  Skin: Skin is warm and dry. No rash noted.   Cardiovascular: Normal heart rate noted  Respiratory: Normal respiratory effort, no problems with respiration noted  Abdomen: Soft, gravid, appropriate for gestational age. Pain/Pressure: Present     Pelvic:       Cervical exam deferred        Extremities: Normal range of motion.     Mental Status: Normal mood and affect. Normal behavior. Normal judgment and thought content.   Urinalysis:      Assessment and Plan:  Pregnancy: G1P0 at 8018w4d  1. Supervision of normal pregnancy, second trimester   Preterm labor symptoms and general obstetric precautions including but not limited to vaginal bleeding, contractions, leaking of fluid and fetal movement were reviewed in detail with the patient. Please refer to After Visit Summary for other counseling recommendations.  Return in about 4 weeks (around 02/22/2016) for 1 hour gtt.   Rhea PinkLori A Clemmons, CNM

## 2016-02-22 ENCOUNTER — Ambulatory Visit (INDEPENDENT_AMBULATORY_CARE_PROVIDER_SITE_OTHER): Payer: BC Managed Care – PPO | Admitting: Advanced Practice Midwife

## 2016-02-22 VITALS — BP 121/75 | HR 93 | Wt 273.0 lb

## 2016-02-22 DIAGNOSIS — Z23 Encounter for immunization: Secondary | ICD-10-CM

## 2016-02-22 DIAGNOSIS — Z3493 Encounter for supervision of normal pregnancy, unspecified, third trimester: Secondary | ICD-10-CM

## 2016-02-22 LAB — CBC
HCT: 33.7 % — ABNORMAL LOW (ref 35.0–45.0)
HEMOGLOBIN: 11.4 g/dL — AB (ref 11.7–15.5)
MCH: 30.6 pg (ref 27.0–33.0)
MCHC: 33.8 g/dL (ref 32.0–36.0)
MCV: 90.3 fL (ref 80.0–100.0)
MPV: 10.5 fL (ref 7.5–12.5)
PLATELETS: 502 10*3/uL — AB (ref 140–400)
RBC: 3.73 MIL/uL — ABNORMAL LOW (ref 3.80–5.10)
RDW: 14.8 % (ref 11.0–15.0)
WBC: 9.4 10*3/uL (ref 3.8–10.8)

## 2016-02-22 LAB — POCT URINALYSIS DIP (DEVICE)
Bilirubin Urine: NEGATIVE
GLUCOSE, UA: NEGATIVE mg/dL
Hgb urine dipstick: NEGATIVE
Ketones, ur: NEGATIVE mg/dL
LEUKOCYTES UA: NEGATIVE
Nitrite: NEGATIVE
Protein, ur: NEGATIVE mg/dL
SPECIFIC GRAVITY, URINE: 1.01 (ref 1.005–1.030)
UROBILINOGEN UA: 0.2 mg/dL (ref 0.0–1.0)
pH: 7 (ref 5.0–8.0)

## 2016-02-22 NOTE — Progress Notes (Signed)
Subjective:  Joann Pearson is a 25 y.o. G1P0 at 2763w4d being seen today for ongoing prenatal care.  She is currently monitored for the following issues for this low-risk pregnancy and has UTI (lower urinary tract infection); Supervision of normal pregnancy; and Obesity on her problem list.  Patient reports no complaints.  Contractions: Not present.  .  Movement: Present. Denies leaking of fluid.   The following portions of the patient's history were reviewed and updated as appropriate: allergies, current medications, past family history, past medical history, past social history, past surgical history and problem list. Problem list updated.  Objective:   Filed Vitals:   02/22/16 0945  BP: 121/75  Pulse: 93  Weight: 273 lb (123.832 kg)    Fetal Status: Fetal Heart Rate (bpm): 138 Fundal Height: 33 cm Movement: Present     General:  Alert, oriented and cooperative. Patient is in no acute distress.  Skin: Skin is warm and dry. No rash noted.   Cardiovascular: Normal heart rate noted  Respiratory: Normal respiratory effort, no problems with respiration noted  Abdomen: Soft, gravid, appropriate for gestational age. Pain/Pressure: Present     Pelvic: Cervical exam deferred        Extremities: Normal range of motion.  Edema: None  Mental Status: Normal mood and affect. Normal behavior. Normal judgment and thought content.   Urinalysis: Urine Protein: Negative Urine Glucose: Negative  Assessment and Plan:  Pregnancy: G1P0 at 6363w4d  1. Prenatal care, third trimester  - RPR - HIV antibody - CBC - Glucose Tolerance, 1 HR (50g)  Preterm labor symptoms and general obstetric precautions including but not limited to vaginal bleeding, contractions, leaking of fluid and fetal movement were reviewed in detail with the patient. Please refer to After Visit Summary for other counseling recommendations.  Return in about 2-3 weeks (around 03/07/2016).   Dorathy KinsmanVirginia Unnamed Zeien, CNM

## 2016-02-22 NOTE — Patient Instructions (Signed)
Tdap Vaccine (Tetanus, Diphtheria and Pertussis): What You Need to Know 1. Why get vaccinated? Tetanus, diphtheria and pertussis are very serious diseases. Tdap vaccine can protect us from these diseases. And, Tdap vaccine given to pregnant women can protect newborn babies against pertussis. TETANUS (Lockjaw) is rare in the [place] today. It causes painful muscle tightening and stiffness, usually all over the body.  It can lead to tightening of muscles in the head and neck so you can't open your mouth, swallow, or sometimes even breathe. Tetanus kills about 1 out of 10 people who are infected even after receiving the best medical care. DIPHTHERIA is also rare in the [place] today. It can cause a thick coating to form in the back of the throat.  It can lead to breathing problems, heart failure, paralysis, and death. PERTUSSIS (Whooping Cough) causes severe coughing spells, which can cause difficulty breathing, vomiting and disturbed sleep.  It can also lead to weight loss, incontinence, and rib fractures. Up to 2 in 100 adolescents and 5 in 100 adults with pertussis are hospitalized or have complications, which could include pneumonia or death. These diseases are caused by bacteria. Diphtheria and pertussis are spread from person to person through secretions from coughing or sneezing. Tetanus enters the body through cuts, scratches, or wounds. Before vaccines, as many as 200,000 cases of diphtheria, 200,000 cases of pertussis, and hundreds of cases of tetanus, were reported in the [place] each year. Since vaccination began, reports of cases for tetanus and diphtheria have dropped by about 99% and for pertussis by about 80%. 2. Tdap vaccine Tdap vaccine can protect adolescents and adults from tetanus, diphtheria, and pertussis. One dose of Tdap is routinely given at age 11 or 12. People who did not get Tdap at that age should get it as soon as possible. Tdap is especially important  for healthcare professionals and anyone having close contact with a baby younger than 12 months. Pregnant women should get a dose of Tdap during every pregnancy, to protect the newborn from pertussis. Infants are most at risk for severe, life-threatening complications from pertussis. Another vaccine, called Td, protects against tetanus and diphtheria, but not pertussis. A Td booster should be given every 10 years. Tdap may be given as one of these boosters if you have never gotten Tdap before. Tdap may also be given after a severe cut or burn to prevent tetanus infection. Your doctor or the person giving you the vaccine can give you more information. Tdap may safely be given at the same time as other vaccines. 3. Some people should not get this vaccine  A person who has ever had a life-threatening allergic reaction after a previous dose of any diphtheria, tetanus or pertussis containing vaccine, OR has a severe allergy to any part of this vaccine, should not get Tdap vaccine. Tell the person giving the vaccine about any severe allergies.  Anyone who had coma or long repeated seizures within 7 days after a childhood dose of DTP or DTaP, or a previous dose of Tdap, should not get Tdap, unless a cause other than the vaccine was found. They can still get Td.  Talk to your doctor if you:  have seizures or another nervous system problem,  had severe pain or swelling after any vaccine containing diphtheria, tetanus or pertussis,  ever had a condition called Guillain-Barr Syndrome (GBS),  aren't feeling well on the day the shot is scheduled. 4. Risks With any medicine, including vaccines, there is   a chance of side effects. These are usually mild and go away on their own. Serious reactions are also possible but are rare. Most people who get Tdap vaccine do not have any problems with it. Mild problems following Tdap (Did not interfere with activities)  Pain where the shot was given (about 3 in 4  adolescents or 2 in 3 adults)  Redness or swelling where the shot was given (about 1 person in 5)  Mild fever of at least 100.4F (up to about 1 in 25 adolescents or 1 in 100 adults)  Headache (about 3 or 4 people in 10)  Tiredness (about 1 person in 3 or 4)  Nausea, vomiting, diarrhea, stomach ache (up to 1 in 4 adolescents or 1 in 10 adults)  Chills, sore joints (about 1 person in 10)  Body aches (about 1 person in 3 or 4)  Rash, swollen glands (uncommon) Moderate problems following Tdap (Interfered with activities, but did not require medical attention)  Pain where the shot was given (up to 1 in 5 or 6)  Redness or swelling where the shot was given (up to about 1 in 16 adolescents or 1 in 12 adults)  Fever over 102F (about 1 in 100 adolescents or 1 in 250 adults)  Headache (about 1 in 7 adolescents or 1 in 10 adults)  Nausea, vomiting, diarrhea, stomach ache (up to 1 or 3 people in 100)  Swelling of the entire arm where the shot was given (up to about 1 in 500). Severe problems following Tdap (Unable to perform usual activities; required medical attention)  Swelling, severe pain, bleeding and redness in the arm where the shot was given (rare). Problems that could happen after any vaccine:  People sometimes faint after a medical procedure, including vaccination. Sitting or lying down for about 15 minutes can help prevent fainting, and injuries caused by a fall. Tell your doctor if you feel dizzy, or have vision changes or ringing in the ears.  Some people get severe pain in the shoulder and have difficulty moving the arm where a shot was given. This happens very rarely.  Any medication can cause a severe allergic reaction. Such reactions from a vaccine are very rare, estimated at fewer than 1 in a million doses, and would happen within a few minutes to a few hours after the vaccination. As with any medicine, there is a very remote chance of a vaccine causing a serious  injury or death. The safety of vaccines is always being monitored. For more information, visit: www.cdc.gov/vaccinesafety/ 5. What if there is a serious problem? What should I look for?  Look for anything that concerns you, such as signs of a severe allergic reaction, very high fever, or unusual behavior.  Signs of a severe allergic reaction can include hives, swelling of the face and throat, difficulty breathing, a fast heartbeat, dizziness, and weakness. These would usually start a few minutes to a few hours after the vaccination. What should I do?  If you think it is a severe allergic reaction or other emergency that can't wait, call 9-1-1 or get the person to the nearest hospital. Otherwise, call your doctor.  Afterward, the reaction should be reported to the Vaccine Adverse Event Reporting System (VAERS). Your doctor might file this report, or you can do it yourself through the VAERS web site at www.vaers.hhs.gov, or by calling 1-800-822-7967. VAERS does not give medical advice.  6. The National Vaccine Injury Compensation Program The National Vaccine Injury Compensation Program (  VICP) is a federal program that was created to compensate people who may have been injured by certain vaccines. Persons who believe they may have been injured by a vaccine can learn about the program and about filing a claim by calling 1-800-338-2382 or visiting the VICP website at www.hrsa.gov/vaccinecompensation. There is a time limit to file a claim for compensation. 7. How can I learn more?  Ask your doctor. He or she can give you the vaccine package insert or suggest other sources of information.  Call your local or state health department.  Contact the Centers for Disease Control and Prevention (CDC):  Call 1-800-232-4636 (1-800-CDC-INFO) or  Visit CDC's website at www.cdc.gov/vaccines CDC Tdap Vaccine VIS (10/27/13)   This information is not intended to replace advice given to you by your health care  provider. Make sure you discuss any questions you have with your health care provider.   Document Released: 02/19/2012 Document Revised: 09/10/2014 Document Reviewed: 12/02/2013 Elsevier Interactive Patient Education 2016 Elsevier Inc.  

## 2016-02-23 LAB — HIV ANTIBODY (ROUTINE TESTING W REFLEX): HIV: NONREACTIVE

## 2016-02-23 LAB — RPR

## 2016-02-23 LAB — GLUCOSE TOLERANCE, 1 HOUR (50G) W/O FASTING: GLUCOSE, 1 HR, GESTATIONAL: 85 mg/dL (ref <140)

## 2016-03-14 ENCOUNTER — Ambulatory Visit (INDEPENDENT_AMBULATORY_CARE_PROVIDER_SITE_OTHER): Payer: BC Managed Care – PPO | Admitting: Certified Nurse Midwife

## 2016-03-14 VITALS — BP 115/72 | HR 107 | Wt 270.3 lb

## 2016-03-14 DIAGNOSIS — O26849 Uterine size-date discrepancy, unspecified trimester: Secondary | ICD-10-CM | POA: Insufficient documentation

## 2016-03-14 DIAGNOSIS — Z3493 Encounter for supervision of normal pregnancy, unspecified, third trimester: Secondary | ICD-10-CM

## 2016-03-14 DIAGNOSIS — O26843 Uterine size-date discrepancy, third trimester: Secondary | ICD-10-CM

## 2016-03-14 LAB — POCT URINALYSIS DIP (DEVICE)
GLUCOSE, UA: NEGATIVE mg/dL
HGB URINE DIPSTICK: NEGATIVE
KETONES UR: NEGATIVE mg/dL
LEUKOCYTES UA: NEGATIVE
Nitrite: NEGATIVE
PROTEIN: 30 mg/dL — AB
SPECIFIC GRAVITY, URINE: 1.015 (ref 1.005–1.030)
Urobilinogen, UA: 1 mg/dL (ref 0.0–1.0)
pH: 6.5 (ref 5.0–8.0)

## 2016-03-14 NOTE — Patient Instructions (Signed)

## 2016-03-14 NOTE — Progress Notes (Signed)
Subjective:  Joann CahillKayla Pearson is a 25 y.o. G1P0 at 1255w4d being seen today for ongoing prenatal care.  She is currently monitored for the following issues for this low-risk pregnancy and has UTI (lower urinary tract infection); Supervision of normal pregnancy; Obesity; and Significant discrepancy between uterine size and clinical dates, antepartum on her problem list.  Patient reports no complaints.  Contractions: Not present. Vag. Bleeding: None.  Movement: Present. Denies leaking of fluid.   The following portions of the patient's history were reviewed and updated as appropriate: allergies, current medications, past family history, past medical history, past social history, past surgical history and problem list. Problem list updated.  Objective:   Filed Vitals:   03/14/16 1049  BP: 115/72  Pulse: 107  Weight: 270 lb 4.8 oz (122.607 kg)    Fetal Status: Fetal Heart Rate (bpm): 156 Fundal Height: 35 cm Movement: Present     General:  Alert, oriented and cooperative. Patient is in no acute distress.  Skin: Skin is warm and dry. No rash noted.   Cardiovascular: Normal heart rate noted  Respiratory: Normal respiratory effort, no problems with respiration noted  Abdomen: Soft, gravid, appropriate for gestational age. Pain/Pressure: Absent     Pelvic: Vag. Bleeding: None     Cervical exam deferred        Extremities: Normal range of motion.  Edema: None  Mental Status: Normal mood and affect. Normal behavior. Normal judgment and thought content.   Urinalysis: Urine Protein: 1+ Urine Glucose: Negative  Assessment and Plan:  Pregnancy: G1P0 at 2255w4d  1. Supervision of normal pregnancy, third trimester - Third trimester anticipatory guidance  2. Significant discrepancy between uterine size and clinical dates, antepartum, third trimester - unclear if r/t body habitus - US MFM OB FOLLOW UP; Future  Preterm labor symptoms and general obstetric precautions including but not limited to vaginal  bleeding, contractions, leaking of fluid and fetal movement were reviewed in detail with the patient. Please refer to After Visit Summary for other counseling recommendations.  Return in about 2 weeks (around 03/28/2016).   Donette LarryMelanie Haygen Zebrowski, CNM

## 2016-03-20 ENCOUNTER — Other Ambulatory Visit: Payer: Self-pay | Admitting: Certified Nurse Midwife

## 2016-03-20 ENCOUNTER — Ambulatory Visit (HOSPITAL_COMMUNITY)
Admission: RE | Admit: 2016-03-20 | Discharge: 2016-03-20 | Disposition: A | Discharge status: Home / Self Care | Payer: BC Managed Care – PPO | Source: Ambulatory Visit | Attending: Certified Nurse Midwife | Admitting: Certified Nurse Midwife

## 2016-03-20 DIAGNOSIS — Z3493 Encounter for supervision of normal pregnancy, unspecified, third trimester: Secondary | ICD-10-CM

## 2016-03-20 DIAGNOSIS — Z3A32 32 weeks gestation of pregnancy: Secondary | ICD-10-CM | POA: Diagnosis not present

## 2016-03-20 DIAGNOSIS — O26843 Uterine size-date discrepancy, third trimester: Secondary | ICD-10-CM | POA: Insufficient documentation

## 2016-03-20 DIAGNOSIS — O99213 Obesity complicating pregnancy, third trimester: Secondary | ICD-10-CM | POA: Diagnosis not present

## 2016-03-27 ENCOUNTER — Telehealth: Payer: Self-pay | Admitting: *Deleted

## 2016-03-27 NOTE — Telephone Encounter (Signed)
Received a message left by patient on the nurse voicemail on 03/26/16 at 1340.  States she is 33 2/[redacted] weeks pregnant and has had some white spotting.  She wants to know if she should be concerned.  Requests a return call to [phone].

## 2016-03-29 ENCOUNTER — Ambulatory Visit (INDEPENDENT_AMBULATORY_CARE_PROVIDER_SITE_OTHER): Payer: BC Managed Care – PPO | Admitting: Family Medicine

## 2016-03-29 VITALS — BP 128/74 | HR 113 | Wt 270.0 lb

## 2016-03-29 DIAGNOSIS — O99213 Obesity complicating pregnancy, third trimester: Secondary | ICD-10-CM

## 2016-03-29 DIAGNOSIS — Z3493 Encounter for supervision of normal pregnancy, unspecified, third trimester: Secondary | ICD-10-CM

## 2016-03-29 DIAGNOSIS — E669 Obesity, unspecified: Secondary | ICD-10-CM

## 2016-03-29 LAB — POCT URINALYSIS DIP (DEVICE)
Glucose, UA: NEGATIVE mg/dL
Glucose, UA: NEGATIVE mg/dL
HGB URINE DIPSTICK: NEGATIVE
Hgb urine dipstick: NEGATIVE
KETONES UR: NEGATIVE mg/dL
Ketones, ur: NEGATIVE mg/dL
Leukocytes, UA: NEGATIVE
NITRITE: NEGATIVE
Nitrite: NEGATIVE
PH: 7.5 (ref 5.0–8.0)
PH: 7.5 (ref 5.0–8.0)
PROTEIN: 30 mg/dL — AB
Protein, ur: 30 mg/dL — AB
SPECIFIC GRAVITY, URINE: 1.02 (ref 1.005–1.030)
Specific Gravity, Urine: 1.02 (ref 1.005–1.030)
Urobilinogen, UA: 1 mg/dL (ref 0.0–1.0)
Urobilinogen, UA: 1 mg/dL (ref 0.0–1.0)

## 2016-03-29 NOTE — Progress Notes (Signed)
Subjective:  Joann Pearson is a 25 y.o. G1P0 at [redacted]w[redacted]d being seen today for ongoing prenatal care.  She is currently monitored for the following issues for this low-risk pregnancy and has UTI (lower urinary tract infection); Supervision of normal pregnancy; Obesity; and Significant discrepancy between uterine size and clinical dates, antepartum on her problem list.  Patient reports had spotting a couple days ago after sex.  None now.  Contractions: Not present.  .  Movement: Present. Denies leaking of fluid.   The following portions of the patient's history were reviewed and updated as appropriate: allergies, current medications, past family history, past medical history, past social history, past surgical history and problem list. Problem list updated.  Objective:   Vitals:   03/29/16 1247  BP: 128/74  Pulse: (!) 113  Weight: 270 lb (122.5 kg)    Fetal Status: Fetal Heart Rate (bpm): 154   Movement: Present     General:  Alert, oriented and cooperative. Patient is in no acute distress.  Skin: Skin is warm and dry. No rash noted.   Cardiovascular: Normal heart rate noted  Respiratory: Normal respiratory effort, no problems with respiration noted  Abdomen: Soft, gravid, appropriate for gestational age. Pain/Pressure: Present     Pelvic:  Cervical exam deferred        Extremities: Normal range of motion.     Mental Status: Normal mood and affect. Normal behavior. Normal judgment and thought content.   Urinalysis:      Assessment and Plan:  Pregnancy: G1P0 at [redacted]w[redacted]d  1.  Supervision Of normal pregnancy   Preterm labor symptoms and general obstetric precautions including but not limited to vaginal bleeding, contractions, leaking of fluid and fetal movement were reviewed in detail with the patient. Please refer to After Visit Summary for other counseling recommendations.  No Follow-up on file.   Levie Heritage, DO

## 2016-03-30 NOTE — Telephone Encounter (Signed)
Joann Pearson was seen for obfu in office 03/29/16 and spotting was addressed.

## 2016-04-02 ENCOUNTER — Ambulatory Visit (INDEPENDENT_AMBULATORY_CARE_PROVIDER_SITE_OTHER): Payer: BC Managed Care – PPO | Admitting: Obstetrics & Gynecology

## 2016-04-02 VITALS — BP 111/71 | HR 81 | Wt 272.7 lb

## 2016-04-02 DIAGNOSIS — Z3403 Encounter for supervision of normal first pregnancy, third trimester: Secondary | ICD-10-CM

## 2016-04-02 LAB — POCT URINALYSIS DIP (DEVICE)
Bilirubin Urine: NEGATIVE
Glucose, UA: NEGATIVE mg/dL
HGB URINE DIPSTICK: NEGATIVE
KETONES UR: NEGATIVE mg/dL
Nitrite: NEGATIVE
PH: 7 (ref 5.0–8.0)
PROTEIN: 30 mg/dL — AB
SPECIFIC GRAVITY, URINE: 1.025 (ref 1.005–1.030)
UROBILINOGEN UA: 2 mg/dL — AB (ref 0.0–1.0)

## 2016-04-02 NOTE — Progress Notes (Signed)
Pt was seen 4 days prev.  Denies new problems.  Visit incorrectly scheduled.  Pt not billed.  F/u in 2 weeks with 36 week cx to be done at that time.  Leeam Cedrone L. Harraway-Smith, M.D., [place]

## 2016-04-19 ENCOUNTER — Encounter: Payer: BC Managed Care – PPO | Admitting: Family

## 2016-04-25 ENCOUNTER — Ambulatory Visit (INDEPENDENT_AMBULATORY_CARE_PROVIDER_SITE_OTHER): Payer: BC Managed Care – PPO | Admitting: Advanced Practice Midwife

## 2016-04-25 VITALS — BP 129/81 | HR 107 | Wt 269.0 lb

## 2016-04-25 DIAGNOSIS — Z113 Encounter for screening for infections with a predominantly sexual mode of transmission: Secondary | ICD-10-CM

## 2016-04-25 DIAGNOSIS — Z3483 Encounter for supervision of other normal pregnancy, third trimester: Secondary | ICD-10-CM

## 2016-04-25 DIAGNOSIS — Z3493 Encounter for supervision of normal pregnancy, unspecified, third trimester: Secondary | ICD-10-CM

## 2016-04-25 LAB — POCT URINALYSIS DIP (DEVICE)
BILIRUBIN URINE: NEGATIVE
Glucose, UA: NEGATIVE mg/dL
HGB URINE DIPSTICK: NEGATIVE
Ketones, ur: NEGATIVE mg/dL
NITRITE: NEGATIVE
PH: 7 (ref 5.0–8.0)
PROTEIN: NEGATIVE mg/dL
Specific Gravity, Urine: 1.02 (ref 1.005–1.030)
UROBILINOGEN UA: 1 mg/dL (ref 0.0–1.0)

## 2016-04-25 LAB — OB RESULTS CONSOLE GC/CHLAMYDIA: Gonorrhea: NEGATIVE

## 2016-04-25 LAB — OB RESULTS CONSOLE GBS: GBS: NEGATIVE

## 2016-04-25 NOTE — Progress Notes (Signed)
Breastfeeding discussed with patient  

## 2016-04-25 NOTE — Progress Notes (Signed)
Patient ID: Lorenda CahillKayla Longshore, female   DOB: 03-22-1991, 25 y.o.   MRN: [id number] Subjective:  Lorenda CahillKayla Stinger is a 25 y.o. G1P0 at 2430w4d being seen today for ongoing prenatal care.  She is currently monitored for the following issues for this low-risk pregnancy and has UTI (lower urinary tract infection); Supervision of normal pregnancy; Obesity; and Significant discrepancy between uterine size and clinical dates, antepartum on her problem list.  Patient reports no complaints.  Contractions: Irritability. Vag. Bleeding: None.  Movement: Present. Denies leaking of fluid.   The following portions of the patient's history were reviewed and updated as appropriate: allergies, current medications, past family history, past medical history, past social history, past surgical history and problem list. Problem list updated.  Objective:   Vitals:   04/25/16 1136  BP: 129/81  Pulse: (!) 107  Weight: 269 lb (122 kg)    Fetal Status: Fetal Heart Rate (bpm): 137 Fundal Height: 41 cm Movement: Present  Presentation: Vertex  General:  Alert, oriented and cooperative. Patient is in no acute distress.  Skin: Skin is warm and dry. No rash noted.   Cardiovascular: Normal heart rate noted  Respiratory: Normal respiratory effort, no problems with respiration noted  Abdomen: Soft, gravid, appropriate for gestational age. Pain/Pressure: Absent     Pelvic:  Cervical exam performed Dilation: Closed Effacement (%): 0 Station: -3  Extremities: Normal range of motion.  Edema: None  Mental Status: Normal mood and affect. Normal behavior. Normal judgment and thought content.   Urinalysis: Urine Protein: Negative Urine Glucose: Negative  Assessment and Plan:  Pregnancy: G1P0 at 4930w4d  1. Supervision of normal pregnancy in third trimester  - Culture, beta strep (group b only) - GC[facility]Chlamydia [facility] amp [facility]  2. Supervision of normal pregnancy, third trimester   Term labor symptoms and general obstetric  precautions including but not limited to vaginal bleeding, contractions, leaking of fluid and fetal movement were reviewed in detail with the patient. Please refer to After Visit Summary for other counseling recommendations.  F/U 1 week   [place] Levonne Carreras, [place]

## 2016-04-26 LAB — GC/CHLAMYDIA PROBE AMP (~~LOC~~) NOT AT ARMC
Chlamydia: NEGATIVE
Neisseria Gonorrhea: NEGATIVE

## 2016-04-27 LAB — CULTURE, BETA STREP (GROUP B ONLY)

## 2016-05-02 ENCOUNTER — Ambulatory Visit (INDEPENDENT_AMBULATORY_CARE_PROVIDER_SITE_OTHER): Payer: BC Managed Care – PPO | Admitting: Obstetrics & Gynecology

## 2016-05-02 VITALS — BP 104/60 | HR 92 | Wt 271.0 lb

## 2016-05-02 DIAGNOSIS — O26843 Uterine size-date discrepancy, third trimester: Secondary | ICD-10-CM

## 2016-05-02 DIAGNOSIS — E669 Obesity, unspecified: Secondary | ICD-10-CM

## 2016-05-02 DIAGNOSIS — O99213 Obesity complicating pregnancy, third trimester: Secondary | ICD-10-CM

## 2016-05-02 DIAGNOSIS — Z3493 Encounter for supervision of normal pregnancy, unspecified, third trimester: Secondary | ICD-10-CM

## 2016-05-02 LAB — POCT URINALYSIS DIP (DEVICE)
BILIRUBIN URINE: NEGATIVE
GLUCOSE, UA: NEGATIVE mg/dL
Hgb urine dipstick: NEGATIVE
KETONES UR: NEGATIVE mg/dL
Leukocytes, UA: NEGATIVE
Nitrite: NEGATIVE
PH: 7 (ref 5.0–8.0)
PROTEIN: NEGATIVE mg/dL
SPECIFIC GRAVITY, URINE: 1.01 (ref 1.005–1.030)
Urobilinogen, UA: 0.2 mg/dL (ref 0.0–1.0)

## 2016-05-02 NOTE — Patient Instructions (Signed)

## 2016-05-02 NOTE — Progress Notes (Signed)
   PRENATAL VISIT NOTE  Subjective:  Joann Pearson is a 25 y.o. G1P0 at 1832w4d being seen today for ongoing prenatal care.  She is currently monitored for the following issues for this high-risk pregnancy and has UTI (lower urinary tract infection); Supervision of normal pregnancy; Obesity; and Significant discrepancy between uterine size and clinical dates, antepartum on her problem list.  Patient reports occasional contractions.  Contractions: Irregular. Vag. Bleeding: None.  Movement: Present. Denies leaking of fluid.   The following portions of the patient's history were reviewed and updated as appropriate: allergies, current medications, past family history, past medical history, past social history, past surgical history and problem list. Problem list updated.  Objective:   Vitals:   05/02/16 1531  BP: 104/60  Pulse: 92  Weight: 271 lb (122.9 kg)    Fetal Status: Fetal Heart Rate (bpm): 132   Movement: Present     General:  Alert, oriented and cooperative. Patient is in no acute distress.  Skin: Skin is warm and dry. No rash noted.   Cardiovascular: Normal heart rate noted  Respiratory: Normal respiratory effort, no problems with respiration noted  Abdomen: Soft, gravid, appropriate for gestational age. Pain/Pressure: Present     Pelvic:  Cervical exam deferred        Extremities: Normal range of motion.  Edema: Trace  Mental Status: Normal mood and affect. Normal behavior. Normal judgment and thought content.   Urinalysis: Urine Protein: Negative Urine Glucose: Negative  Assessment and Plan:  Pregnancy: G1P0 at 7032w4d  1. Supervision of normal pregnancy, third trimester  2. Significant discrepancy between uterine size and clinical dates, antepartum, third trimester 03/20/2016 EFW 54th%ile 3. Obesity  Term labor symptoms and general obstetric precautions including but not limited to vaginal bleeding, contractions, leaking of fluid and fetal movement were reviewed in detail  with the patient. Please refer to After Visit Summary for other counseling recommendations.  Return in about 1 week (around 05/09/2016).  Willodean Rosenthalarolyn Harraway-Smith, MD

## 2016-05-10 ENCOUNTER — Ambulatory Visit (INDEPENDENT_AMBULATORY_CARE_PROVIDER_SITE_OTHER): Payer: BC Managed Care – PPO | Admitting: Family Medicine

## 2016-05-10 VITALS — BP 130/74 | HR 82 | Wt 273.4 lb

## 2016-05-10 DIAGNOSIS — Z3493 Encounter for supervision of normal pregnancy, unspecified, third trimester: Secondary | ICD-10-CM

## 2016-05-10 NOTE — Progress Notes (Signed)
   PRENATAL VISIT NOTE  Subjective:  Joann Pearson is a 25 y.o. G1P0 at 4063w5d being seen today for ongoing prenatal care.  She is currently monitored for the following issues for this low-risk pregnancy and has UTI (lower urinary tract infection); Supervision of normal pregnancy; Obesity; and Significant discrepancy between uterine size and clinical dates, antepartum on her problem list.  Patient reports no complaints.  Feels tired all the time now.  Contractions: Irregular. Vag. Bleeding: None.  Movement: Present. Denies leaking of fluid.   The following portions of the patient's history were reviewed and updated as appropriate: allergies, current medications, past family history, past medical history, past social history, past surgical history and problem list. Problem list updated.  Objective:   Vitals:   05/10/16 1537  BP: 130/74  Pulse: 82  Weight: 273 lb 6.4 oz (124 kg)    Fetal Status: Fetal Heart Rate (bpm): 140   Movement: Present  Presentation: Vertex  General:  Alert, oriented and cooperative. Patient is in no acute distress.  Skin: Skin is warm and dry. No rash noted.   Cardiovascular: Normal heart rate noted  Respiratory: Normal respiratory effort, no problems with respiration noted  Abdomen: Soft, gravid, appropriate for gestational age. Pain/Pressure: Present     Pelvic:  Cervical exam performed Dilation: Closed Effacement (%): 0 Station: Ballotable  Extremities: Normal range of motion.  Edema: Trace  Mental Status: Normal mood and affect. Normal behavior. Normal judgment and thought content.   Urinalysis:      Assessment and Plan:  Pregnancy: G1P0 at 5563w5d  1. Supervision of normal pregnancy, third trimester FHT and FH normal  Term labor symptoms and general obstetric precautions including but not limited to vaginal bleeding, contractions, leaking of fluid and fetal movement were reviewed in detail with the patient. Please refer to After Visit Summary for other  counseling recommendations.  Return in about 1 week (around 05/17/2016).  Levie HeritageJacob J Cebastian Neis, DO

## 2016-05-16 ENCOUNTER — Ambulatory Visit (INDEPENDENT_AMBULATORY_CARE_PROVIDER_SITE_OTHER): Payer: BC Managed Care – PPO | Admitting: *Deleted

## 2016-05-16 VITALS — BP 110/73 | HR 104

## 2016-05-16 DIAGNOSIS — O480 Post-term pregnancy: Secondary | ICD-10-CM | POA: Diagnosis not present

## 2016-05-16 DIAGNOSIS — Z36 Encounter for antenatal screening of mother: Secondary | ICD-10-CM | POA: Diagnosis not present

## 2016-05-16 NOTE — Progress Notes (Signed)
Pt desires IOL @ 41 wks - scheduled 9/16 @ 0800

## 2016-05-17 ENCOUNTER — Telehealth (HOSPITAL_COMMUNITY): Payer: Self-pay | Admitting: *Deleted

## 2016-05-17 ENCOUNTER — Encounter (HOSPITAL_COMMUNITY): Payer: Self-pay | Admitting: *Deleted

## 2016-05-17 NOTE — Telephone Encounter (Signed)
Preadmission screen  

## 2016-05-19 ENCOUNTER — Inpatient Hospital Stay (HOSPITAL_COMMUNITY)
Admission: RE | Admit: 2016-05-19 | Discharge: 2016-05-23 | DRG: 765 | Disposition: A | Discharge status: Home / Self Care | Payer: BC Managed Care – PPO | Source: Ambulatory Visit | Attending: Obstetrics & Gynecology | Admitting: Obstetrics & Gynecology

## 2016-05-19 ENCOUNTER — Encounter (HOSPITAL_COMMUNITY): Payer: Self-pay

## 2016-05-19 ENCOUNTER — Other Ambulatory Visit: Payer: Self-pay | Admitting: Advanced Practice Midwife

## 2016-05-19 DIAGNOSIS — Z3A41 41 weeks gestation of pregnancy: Secondary | ICD-10-CM | POA: Diagnosis not present

## 2016-05-19 DIAGNOSIS — O480 Post-term pregnancy: Secondary | ICD-10-CM | POA: Diagnosis present

## 2016-05-19 DIAGNOSIS — O99214 Obesity complicating childbirth: Secondary | ICD-10-CM | POA: Diagnosis present

## 2016-05-19 DIAGNOSIS — Z6841 Body Mass Index (BMI) 40.0 and over, adult: Secondary | ICD-10-CM | POA: Diagnosis not present

## 2016-05-19 LAB — CBC
HCT: 33.9 % — ABNORMAL LOW (ref 36.0–46.0)
HEMOGLOBIN: 11.9 g/dL — AB (ref 12.0–15.0)
MCH: 31.3 pg (ref 26.0–34.0)
MCHC: 35.1 g/dL (ref 30.0–36.0)
MCV: 89.2 fL (ref 78.0–100.0)
Platelets: 474 10*3/uL — ABNORMAL HIGH (ref 150–400)
RBC: 3.8 MIL/uL — ABNORMAL LOW (ref 3.87–5.11)
RDW: 14.5 % (ref 11.5–15.5)
WBC: 9.2 10*3/uL (ref 4.0–10.5)

## 2016-05-19 LAB — TYPE AND SCREEN
ABO/RH(D): O POS
Antibody Screen: NEGATIVE

## 2016-05-19 LAB — ABO/RH: ABO/RH(D): O POS

## 2016-05-19 MED ORDER — OXYCODONE-ACETAMINOPHEN 5-325 MG PO TABS: 2.0000 | ORAL_TABLET | ORAL | Status: DC | PRN

## 2016-05-19 MED ORDER — MISOPROSTOL 25 MCG QUARTER TABLET
25.0000 ug | ORAL_TABLET | ORAL | Status: DC | PRN
  Administered 2016-05-19 (×2): 25 ug via VAGINAL
  Filled 2016-05-19 (×2): qty 0.25

## 2016-05-19 MED ORDER — FENTANYL CITRATE (PF) 100 MCG/2ML IJ SOLN
100.0000 ug | INTRAMUSCULAR | Status: DC | PRN
  Administered 2016-05-20 (×2): 100 ug via INTRAVENOUS
  Filled 2016-05-19 (×2): qty 2

## 2016-05-19 MED ORDER — ACETAMINOPHEN 325 MG PO TABS: 650.0000 mg | ORAL_TABLET | ORAL | Status: DC | PRN

## 2016-05-19 MED ORDER — OXYTOCIN 40 UNITS IN LACTATED RINGERS INFUSION - SIMPLE MED: 2.5000 [IU]/h | INTRAVENOUS | Status: DC

## 2016-05-19 MED ORDER — LACTATED RINGERS IV SOLN
INTRAVENOUS | Status: DC
  Administered 2016-05-19 – 2016-05-21 (×7): via INTRAVENOUS

## 2016-05-19 MED ORDER — OXYCODONE-ACETAMINOPHEN 5-325 MG PO TABS: 1.0000 | ORAL_TABLET | ORAL | Status: DC | PRN

## 2016-05-19 MED ORDER — OXYTOCIN BOLUS FROM INFUSION: 500.0000 mL | Freq: Once | INTRAVENOUS | Status: DC

## 2016-05-19 MED ORDER — LIDOCAINE HCL (PF) 1 % IJ SOLN: 30.0000 mL | INTRAMUSCULAR | Status: DC | PRN

## 2016-05-19 MED ORDER — TERBUTALINE SULFATE 1 MG/ML IJ SOLN
0.2500 mg | Freq: Once | INTRAMUSCULAR | Status: AC | PRN
  Administered 2016-05-19: 0.25 mg via SUBCUTANEOUS
  Filled 2016-05-19: qty 1

## 2016-05-19 MED ORDER — LACTATED RINGERS IV SOLN: 500.0000 mL | INTRAVENOUS | Status: DC | PRN

## 2016-05-19 MED ORDER — ONDANSETRON HCL 4 MG/2ML IJ SOLN
4.0000 mg | Freq: Four times a day (QID) | INTRAMUSCULAR | Status: DC | PRN
  Administered 2016-05-20: 4 mg via INTRAVENOUS
  Filled 2016-05-19: qty 2

## 2016-05-19 MED ORDER — SOD CITRATE-CITRIC ACID 500-334 MG/5ML PO SOLN
30.0000 mL | ORAL | Status: DC | PRN
  Administered 2016-05-21: 30 mL via ORAL
  Filled 2016-05-19: qty 15

## 2016-05-19 NOTE — H&P (Signed)
Obstetric History and Physical  Joann Pearson is a 25 y.o. G1P0 with IUP at 6953w0d presenting for IOL for postdates. Patient states she has been having no contractions, no vaginal bleeding, intact membranes, with active fetal movement.    [facility] Pregnancy complications or risks: Patient Active Problem List   Diagnosis Date Noted  . Post term pregnancy, 41 weeks 05/19/2016  . Supervision of normal pregnancy 11/02/2015  . Obesity 11/02/2015   [facility]atal Labs  Dating  LMP, consistent with 18 wk ultrasound Blood type: O/POS/-- (01/05 1426)   Genetic Screen  NT normal,  1 Screen:  normal   AFP: Neg    NIPS: Antibody:NEG (01/05 1426)  Anatomic US  Nml @ 18 wks > rescan in 4 wks > nml 22 wks; 32 wks(s>d)-nml growth 54%, nml AFI Rubella: 6.34 (01/05 1426)  GTT Early: 87           Third trimester: 85 RPR: NON REAC (01/05 1426)   Flu vaccine  Declined HBsAg: NEGATIVE (01/05 1426)   TDaP vaccine 02/22/16                                      HIV: NONREACTIVE (01/05 1426)   Baby Food  Breast and bottle                                ZOX:[id number]:Negative (08/23 0000)  Contraception Pt does not believe in hormonal contraception; condoms  Pap:  negative  Circumcision  Outpatient   Pediatrician  Given list   Support Person  FOB Alinda Money- Tony     Past Medical History:  Diagnosis Date  . Medical history non-contributory     Past Surgical History:  Procedure Laterality Date  . SPLENECTOMY      OB History  Gravida Para Term Preterm AB Living  1            SAB TAB Ectopic Multiple Live Births               # Outcome Date GA Lbr Len/2nd Weight Sex Delivery Anes PTL Lv  1 Current               Social History   Social History  . Marital status: Single    Spouse name: N/A  . Number of children: N/A  . Years of education: N/A   Social History Main Topics  . Smoking status: Never Smoker  . Smokeless tobacco: Never Used  . Alcohol use No  . Drug  use: No  . Sexual activity: Not Asked   Other Topics Concern  . None   Social History Narrative  . None    No family history on file.  Prescriptions Prior to Admission  Medication Sig Dispense Refill Last Dose  . Docosahexaenoic Acid (DHA PO) Take 1 tablet by mouth daily. Reported on 02/22/2016   05/18/2016 at Unknown time  . Prenatal Vit-Fe Fumarate-FA (MULTIVITAMIN-PRENATAL) 27-0.8 MG TABS tablet Take 1 tablet by mouth daily at 12 noon.   05/18/2016 at Unknown time    Allergies  Allergen Reactions  . Orange Concentrate [Flavoring Agent] Hives  . Orange Fruit [Citrus] Hives    Review of Systems: Negative except for what is mentioned in HPI.  Physical Exam: BP (!) 145/86   Pulse 91  Temp 98.8 F (37.1 C) (Oral)   Resp 18   Ht 5\' 3"  (1.6 m)   Wt 272 lb (123.4 kg)   LMP 08/06/2015 (Exact Date)   BMI 48.18 kg/m  CONSTITUTIONAL: Well-developed, well-nourished female in no acute distress.  HENT:  Normocephalic, atraumatic, External right and left ear normal. Oropharynx is clear and moist EYES: Conjunctivae and EOM are normal. Pupils are equal, round, and reactive to light. No scleral icterus.  NECK: Normal range of motion, supple, no masses SKIN: Skin is warm and dry. No rash noted. Not diaphoretic. No erythema. No pallor. NEUROLOGIC: Alert and oriented to person, place, and time. Normal reflexes, muscle tone coordination. No cranial nerve deficit noted. PSYCHIATRIC: Normal mood and affect. Normal behavior. Normal judgment and thought content. CARDIOVASCULAR: Normal heart rate noted, regular rhythm RESPIRATORY: Effort and breath sounds normal, no problems with respiration noted ABDOMEN: Soft, nontender, nondistended, gravid. MUSCULOSKELETAL: Normal range of motion. No edema and no tenderness. 2+ distal pulses.  Cervical Exam: Dilatation 0.5cm   Effacement 0%   Station -3   Presentation: cephalic FHT:  Baseline rate 130 bpm   Variability moderate  Accelerations present    Decelerations none Contractions: Rare   Pertinent Labs/Studies:   Results for orders placed or performed during the hospital encounter of 05/19/16 (from the past 24 hour(s))  CBC     Status: Abnormal   Collection Time: 05/19/16  8:40 AM  Result Value Ref Range   WBC 9.2 4.0 - 10.5 K/uL   RBC 3.80 (L) 3.87 - 5.11 MIL/uL   Hemoglobin 11.9 (L) 12.0 - 15.0 g/dL   HCT 16.1 (L) 09.6 - 04.5 %   MCV 89.2 78.0 - 100.0 fL   MCH 31.3 26.0 - 34.0 pg   MCHC 35.1 30.0 - 36.0 g/dL   RDW 40.9 81.1 - 91.4 %   Platelets 474 (H) 150 - 400 K/uL  Type and screen     Status: None   Collection Time: 05/19/16  8:40 AM  Result Value Ref Range   ABO/RH(D) O POS    Antibody Screen NEG    Sample Expiration 05/22/2016     Assessment : Cory Rama is a 25 y.o. G1P0 at [redacted]w[redacted]d being admitted for induction of labor due to postdates.  Plan: Labor: Expectant management.  Induction per protocol starting with misoprostol.  FWB: Reassuring fetal heart tracing.  GBS negative Delivery plan: Hopeful for vaginal delivery   Jaynie Collins, MD, FACOG Attending Obstetrician & Gynecologist Faculty Practice, [facility]

## 2016-05-19 NOTE — Progress Notes (Signed)
Patient ID: Joann Pearson, female   DOB: 11/17/1990, 25 y.o.   MRN: [id number] Joann CahillKayla Pearson is a 25 y.o. G1P0 at 6451w0d admitted for induction of labor due to postdates. Contracting too much for RN to place cytotec  Subjective: Doing well, can tell she's having uc's, but not uncomfortable  Objective: BP 130/67 (BP Location: Left Arm)   Pulse 89   Temp 98.8 F (37.1 C) (Oral)   Resp 17   Ht 5\' 3"  (1.6 m)   Wt 123.4 kg (272 lb)   LMP 08/06/2015 (Exact Date)   BMI 48.18 kg/m  No intake/output data recorded.  FHT:  FHR: 125 bpm, variability: moderate,  accelerations:  Present,  decelerations:  Absent UC:   q 2-705mins  SVE:   Dilation: Fingertip Effacement (%): 50 Station: -2 Exam by:: Joellyn HaffKim Arionne Iams CNM Cytotec 25mcg pv placed   Labs: Lab Results  Component Value Date   WBC 9.2 05/19/2016   HGB 11.9 (L) 05/19/2016   HCT 33.9 (L) 05/19/2016   MCV 89.2 05/19/2016   PLT 474 (H) 05/19/2016    Assessment / Plan: IOL d/t postdates, 2nd cytotec placed, plan for cervical foley bulb when able  Labor: cervical ripening Fetal Wellbeing:  Category I Pain Control:  n/a Pre-eclampsia: n/a I/D:  gbs neg Anticipated MOD:  NSVD  Marge DuncansBooker, Jasson Siegmann Randall CNM, WHNP-BC 05/19/2016, 6:11 PM

## 2016-05-19 NOTE — Progress Notes (Signed)
Patient ID: Joann Pearson, female   DOB: August 24, 1991, 24 y.o.   MRN: [id number] Joann CahillKayla Kravitz is a 25 y.o. G1P0 at 3526w0d admitted for induction of labor due to Post dates. Due date 05/12/16.  Subjective: Comfortable, no complaints  Objective: BP (!) 114/43   Pulse (!) 103   Temp 99.1 F (37.3 C) (Oral)   Resp 20   Ht 5\' 3"  (1.6 m)   Wt 123.4 kg (272 lb)   LMP 08/06/2015 (Exact Date)   BMI 48.18 kg/m  No intake/output data recorded.  FHT:  FHR: 140 bpm, variability: moderate,  accelerations:  Present,  decelerations:  Absent FHR reactive/reassuring/Cat 1 x 1 hr after interventions, then had brief period of rise in baseline w/ variables again that spontaneously resolved and returned to Cat 1- Dr. Macon LargeAnyanwu reviewed all  UC:   ~q 3-714mins  SVE:   Dilation: 1 Effacement (%): 60 Station: -2 Exam by:: Joellyn HaffKim Ilaria Much CNM  Cervical foley bulb inserted and inflated w/ 60ml LR w/o difficulty   Labs: Lab Results  Component Value Date   WBC 9.2 05/19/2016   HGB 11.9 (L) 05/19/2016   HCT 33.9 (L) 05/19/2016   MCV 89.2 05/19/2016   PLT 474 (H) 05/19/2016    Assessment / Plan: IOL d/t postdates, s/p cytotec x 2, occ periods of rise in baseline w/ variables then spontaneous resolution and return to Cat I tracing, cervical foley bulb now placed  Labor: cervical ripening Fetal Wellbeing:  Category I Pain Control:  n/a Pre-eclampsia: n/a I/D:  gbs neg Anticipated MOD:  NSVD  Marge DuncansBooker, Ysenia Filice Randall CNM, WHNP-BC 05/19/2016, 11:58 PM

## 2016-05-19 NOTE — Progress Notes (Signed)
LABOR PROGRESS NOTE  Joann Pearson is a 25 y.o. G1P0 at 10030w0d  admitted for IOL for postdates  Subjective: Patient with some discomfort, but overall doing well.  Objective: BP 127/68   Pulse 91   Temp 98.5 F (36.9 C) (Oral)   Resp 16   Ht 5\' 3"  (1.6 m)   Wt 272 lb (123.4 kg)   LMP 08/06/2015 (Exact Date)   BMI 48.18 kg/m  or  Vitals:   05/19/16 1811 05/19/16 1914 05/19/16 1929 05/19/16 2110  BP: 136/71 128/75 127/68   Pulse: 88 83 91   Resp: 17 18 16 16   Temp:  98.3 F (36.8 C) 98.3 F (36.8 C) 98.5 F (36.9 C)  TempSrc:  Oral Oral Oral  Weight:      Height:        Dilation: 1 Effacement (%): 60 Cervical Position: Anterior Station: -2 Presentation: Vertex Exam by:: Joellyn HaffKim Booker CNM  FHT: baseline 150, moderate variability, +acel recurrent variables Toco: ctx 2-4 min  Labs: Lab Results  Component Value Date   WBC 9.2 05/19/2016   HGB 11.9 (L) 05/19/2016   HCT 33.9 (L) 05/19/2016   MCV 89.2 05/19/2016   PLT 474 (H) 05/19/2016    Patient Active Problem List   Diagnosis Date Noted  . Post term pregnancy, 41 weeks 05/19/2016  . Supervision of normal pregnancy 11/02/2015  . Obesity 11/02/2015    Assessment / Plan: 25 y.o. G1P0 at 3430w0d here for IOL for postdates. Having recurrent variables, but good variability. Patient has been repositioned, given IVF bolus, and terbutaline given.   Labor: s/p cytotec x 2. Will monitor FWB for now after interventions above. Plan for foley bulb placement and strip improved.  Fetal Wellbeing:  Category II Pain Control:  N/a. May have epidural when in active labor if pt desires Anticipated MOD:  SVD  Plan discussed with attending physician.   Frederik PearJulie P Degele, MD 05/19/2016, 10:17 PM

## 2016-05-19 NOTE — Anesthesia Pain Management Evaluation Note (Signed)
  CRNA Pain Management Visit Note  Patient: Joann CahillKayla Gargus, 25 y.o., female  "Hello I am a [facility] of the anesthesia [facility]. We have an anesthesia team available at all times to provide care throughout the hospital, including epidural management and anesthesia for C-section. I don't know your plan for the delivery whether it a natural birth, water birth, IV sedation, nitrous supplementation, doula or epidural, but we want to meet your pain goals."   1.Was your pain managed to your expectations on prior hospitalizations?   No prior hospitalizations  2.What is your expectation for pain management during this hospitalization?     Nitrous Oxide  3.How can we help you reach that goal? N2O.  Patient aware of all possible pain control interventions.  Record the patient's initial score and the patient's pain goal.   Pain: 6  Pain Goal: 6 [facility] wants you to be able to say your pain was always managed very well.  Kwesi Sangha L 05/19/2016

## 2016-05-20 ENCOUNTER — Inpatient Hospital Stay (HOSPITAL_COMMUNITY): Payer: BC Managed Care – PPO | Admitting: Anesthesiology

## 2016-05-20 LAB — RPR: RPR Ser Ql: NONREACTIVE

## 2016-05-20 MED ORDER — EPHEDRINE 5 MG/ML INJ: 10.0000 mg | INTRAVENOUS | Status: DC | PRN

## 2016-05-20 MED ORDER — PHENYLEPHRINE 40 MCG/ML (10ML) SYRINGE FOR IV PUSH (FOR BLOOD PRESSURE SUPPORT)
PREFILLED_SYRINGE | INTRAVENOUS | Status: DC
Start: 2016-05-20 — End: 2016-05-21
  Filled 2016-05-20: qty 10

## 2016-05-20 MED ORDER — PHENYLEPHRINE 40 MCG/ML (10ML) SYRINGE FOR IV PUSH (FOR BLOOD PRESSURE SUPPORT)
80.0000 ug | PREFILLED_SYRINGE | INTRAVENOUS | Status: DC | PRN
Start: 2016-05-20 — End: 2016-05-21

## 2016-05-20 MED ORDER — LACTATED RINGERS IV SOLN
500.0000 mL | Freq: Once | INTRAVENOUS | Status: AC
  Administered 2016-05-20: 500 mL via INTRAVENOUS

## 2016-05-20 MED ORDER — DIPHENHYDRAMINE HCL 50 MG/ML IJ SOLN: 12.5000 mg | INTRAMUSCULAR | Status: DC | PRN

## 2016-05-20 MED ORDER — PHENYLEPHRINE 40 MCG/ML (10ML) SYRINGE FOR IV PUSH (FOR BLOOD PRESSURE SUPPORT): 80.0000 ug | PREFILLED_SYRINGE | INTRAVENOUS | Status: DC | PRN

## 2016-05-20 MED ORDER — FENTANYL 2.5 MCG/ML BUPIVACAINE 1/10 % EPIDURAL INFUSION (WH - ANES)
14.0000 mL/h | INTRAMUSCULAR | Status: DC | PRN
  Administered 2016-05-21: 14 mL/h via EPIDURAL
  Filled 2016-05-20: qty 125

## 2016-05-20 MED ORDER — LACTATED RINGERS IV SOLN: 500.0000 mL | Freq: Once | INTRAVENOUS | Status: DC

## 2016-05-20 MED ORDER — FENTANYL 2.5 MCG/ML BUPIVACAINE 1/10 % EPIDURAL INFUSION (WH - ANES)
INTRAMUSCULAR | Status: AC
  Filled 2016-05-20: qty 125

## 2016-05-20 MED ORDER — FENTANYL 2.5 MCG/ML BUPIVACAINE 1/10 % EPIDURAL INFUSION (WH - ANES)
14.0000 mL/h | INTRAMUSCULAR | Status: DC | PRN
  Administered 2016-05-20: 14 mL/h via EPIDURAL

## 2016-05-20 MED ORDER — TERBUTALINE SULFATE 1 MG/ML IJ SOLN: 0.2500 mg | Freq: Once | INTRAMUSCULAR | Status: DC | PRN

## 2016-05-20 MED ORDER — LIDOCAINE HCL (PF) 1 % IJ SOLN
INTRAMUSCULAR | Status: DC | PRN
  Administered 2016-05-20 (×2): 5 mL

## 2016-05-20 MED ORDER — OXYTOCIN 40 UNITS IN LACTATED RINGERS INFUSION - SIMPLE MED
1.0000 m[IU]/min | INTRAVENOUS | Status: DC
  Administered 2016-05-20: 1 m[IU]/min via INTRAVENOUS
  Filled 2016-05-20: qty 1000

## 2016-05-20 NOTE — Progress Notes (Signed)
Joann CahillKayla Pearson is a 25 y.o. G1P0 at 171w1d by ultrasound admitted for induction of labor due to Post dates. Due date 05/12/16.  Subjective:   Objective: BP 127/71   Pulse 91   Temp 98 F (36.7 C) (Oral)   Resp 18   Ht 5\' 3"  (1.6 m)   Wt 272 lb (123.4 kg)   LMP 08/06/2015 (Exact Date)   SpO2 98%   BMI 48.18 kg/m  No intake/output data recorded. No intake/output data recorded.  FHT:  FHR: 120's bpm, variability: moderate,  accelerations:  Present,  decelerations:  Absent UC:   irregular, every 3-7 minutes SVE:   Dilation: 6 Effacement (%): 60 Station: -2 Exam by:: Allis, S-CNM  Labs: Lab Results  Component Value Date   WBC 9.2 05/19/2016   HGB 11.9 (L) 05/19/2016   HCT 33.9 (L) 05/19/2016   MCV 89.2 05/19/2016   PLT 474 (H) 05/19/2016    Assessment / Plan: Induction of labor due to postterm,  progressing well on pitocin  Labor: Progressing normally Preeclampsia:  no signs or symptoms of toxicity, intake and ouput balanced and labs stable Fetal Wellbeing:  Category II Pain Control:  Labor support without medications I/D:  n/a Anticipated MOD:  NSVD  Joann Pearson 05/20/2016, 10:09 AM

## 2016-05-20 NOTE — Progress Notes (Signed)
Joann CahillKayla Pearson is a 25 y.o. G1P0 at 266w1d by ultrasound admitted for induction of labor due to Post dates. Due date 05/12/16.  Subjective:   Objective: BP 116/63   Pulse 88   Temp 98.6 F (37 C) (Oral)   Resp 18   Ht 5\' 3"  (1.6 m)   Wt 272 lb (123.4 kg)   LMP 08/06/2015 (Exact Date)   SpO2 99%   BMI 48.18 kg/m  No intake/output data recorded. No intake/output data recorded.  FHT:  FHR: 130's bpm, variability: moderate,  accelerations:  Present,  decelerations:  Absent UC:   regular, every 3-4 minutes SVE:   Dilation: 6 Effacement (%): 90 Station: -2 Exam by:: Craige CottaAmanda Loye, RN  Labs: Lab Results  Component Value Date   WBC 9.2 05/19/2016   HGB 11.9 (L) 05/19/2016   HCT 33.9 (L) 05/19/2016   MCV 89.2 05/19/2016   PLT 474 (H) 05/19/2016    Assessment / Plan: Induction of labor due to postterm,  progressing well on pitocin  Labor: Progressing normally Preeclampsia:  no signs or symptoms of toxicity, intake and ouput balanced and labs stable Fetal Wellbeing:  Category I Pain Control:  Labor support without medications I/D:  n/a Anticipated MOD:  NSVD  Joann Pearson 05/20/2016, 4:52 PM

## 2016-05-20 NOTE — Anesthesia Procedure Notes (Signed)
Epidural Patient location during procedure: OB Start time: 05/20/2016 6:54 PM End time: 05/20/2016 6:59 PM  Staffing Anesthesiologist: Bonita QuinGUIDETTI, Jashira Cotugno S Performed: anesthesiologist   Preanesthetic Checklist Completed: patient identified, site marked, surgical consent, pre-op evaluation, timeout performed, IV checked, risks and benefits discussed and monitors and equipment checked  Epidural Patient position: sitting Prep: site prepped and draped and DuraPrep Patient monitoring: continuous pulse ox, blood pressure, heart rate and cardiac monitor Approach: midline Location: L4-L5 Injection technique: LOR air  Needle:  Needle type: Tuohy  Needle gauge: 17 G Needle length: 9 cm and 9 Needle insertion depth: 8 cm Catheter type: closed end flexible Catheter size: 19 Gauge Catheter at skin depth: 13 cm Test dose: negative  Assessment Events: blood not aspirated, injection not painful, no injection resistance, negative IV test and no paresthesia

## 2016-05-20 NOTE — Anesthesia Preprocedure Evaluation (Signed)
Anesthesia Evaluation  Patient identified by MRN, date of birth, ID band Patient awake    Reviewed: Allergy & Precautions, NPO status , Patient's Chart, lab work & pertinent test results  Airway Mallampati: III  TM Distance: >3 FB Neck ROM: Full    Dental no notable dental hx.    Pulmonary neg pulmonary ROS,    Pulmonary exam normal        Cardiovascular negative cardio ROS Normal cardiovascular exam     Neuro/Psych negative neurological ROS  negative psych ROS   GI/Hepatic negative GI ROS, Neg liver ROS,   Endo/Other  negative endocrine ROSMorbid obesity  Renal/GU negative Renal ROS  negative genitourinary   Musculoskeletal negative musculoskeletal ROS (+)   Abdominal   Peds negative pediatric ROS (+)  Hematology negative hematology ROS (+)   Anesthesia Other Findings   Reproductive/Obstetrics (+) Pregnancy                             Anesthesia Physical Anesthesia Plan  ASA: II  Anesthesia Plan: Epidural   Post-op Pain Management:    Induction:   Airway Management Planned:   Additional Equipment:   Intra-op Plan:   Post-operative Plan:   Informed Consent:   Plan Discussed with:   Anesthesia Plan Comments:         Anesthesia Quick Evaluation

## 2016-05-20 NOTE — Progress Notes (Signed)
LABOR PROGRESS NOTE  Joann Pearson is a 25 y.o. G1P0 at 6134w0d  admitted for IOL for postdates  Subjective: Patient resting comfortably. Reports some cramping, but otherwise doing well.   Objective: BP 138/90   Pulse 100   Temp 98.5 F (36.9 C) (Oral)   Resp 18   Ht 5\' 3"  (1.6 m)   Wt 272 lb (123.4 kg)   LMP 08/06/2015 (Exact Date)   BMI 48.18 kg/m  or  Vitals:   05/19/16 1929 05/19/16 2110 05/19/16 2301 05/20/16 0043  BP: 127/68  (!) 114/43 138/90  Pulse: 91  (!) 103 100  Resp: 16 16 20 18   Temp: 98.3 F (36.8 C) 98.5 F (36.9 C) 99.1 F (37.3 C) 98.5 F (36.9 C)  TempSrc: Oral Oral Oral Oral  Weight:      Height:       NAD Dilation: 1 Effacement (%): 60 Cervical Position: Anterior Station: -2 Presentation: Vertex Exam by:: Joellyn HaffKim Booker CNM  FHT: baseline 135, moderate variability, +acel, no decel Toco: ctx 3-6 min  Labs: Lab Results  Component Value Date   WBC 9.2 05/19/2016   HGB 11.9 (L) 05/19/2016   HCT 33.9 (L) 05/19/2016   MCV 89.2 05/19/2016   PLT 474 (H) 05/19/2016    Patient Active Problem List   Diagnosis Date Noted  . Post term pregnancy, 41 weeks 05/19/2016  . Supervision of normal pregnancy 11/02/2015  . Obesity 11/02/2015    Assessment / Plan: 25 y.o. G1P0 at 6834w0d here for IOL for postdates.  Labor: s/p cytotec x 2. Foley bulb placed at 12am Fetal Wellbeing:  Category I Pain Control:  N/a. May have epidural when in active labor if pt desires Anticipated MOD:  SVD   Frederik PearJulie P Theodis Kinsel, MD 05/20/2016, 5:10 AM

## 2016-05-21 ENCOUNTER — Encounter (HOSPITAL_COMMUNITY): Payer: Self-pay

## 2016-05-21 ENCOUNTER — Encounter (HOSPITAL_COMMUNITY)
Admission: RE | Disposition: A | Discharge status: Home / Self Care | Payer: Self-pay | Source: Ambulatory Visit | Attending: Obstetrics & Gynecology

## 2016-05-21 ENCOUNTER — Encounter: Payer: BC Managed Care – PPO | Admitting: Obstetrics and Gynecology

## 2016-05-21 DIAGNOSIS — Z3A41 41 weeks gestation of pregnancy: Secondary | ICD-10-CM

## 2016-05-21 DIAGNOSIS — O480 Post-term pregnancy: Secondary | ICD-10-CM

## 2016-05-21 LAB — CBC
HEMATOCRIT: 24.7 % — AB (ref 36.0–46.0)
HEMATOCRIT: 26.8 % — AB (ref 36.0–46.0)
HEMATOCRIT: 25.2 % — AB (ref 36.0–46.0)
HEMOGLOBIN: 8.8 g/dL — AB (ref 12.0–15.0)
HEMOGLOBIN: 9.1 g/dL — AB (ref 12.0–15.0)
Hemoglobin: 8.5 g/dL — ABNORMAL LOW (ref 12.0–15.0)
MCH: 32 pg (ref 26.0–34.0)
MCH: 30.4 pg (ref 26.0–34.0)
MCH: 30.6 pg (ref 26.0–34.0)
MCHC: 35.6 g/dL (ref 30.0–36.0)
MCHC: 34 g/dL (ref 30.0–36.0)
MCHC: 33.7 g/dL (ref 30.0–36.0)
MCV: 89.8 fL (ref 78.0–100.0)
MCV: 89.6 fL (ref 78.0–100.0)
MCV: 90.6 fL (ref 78.0–100.0)
PLATELETS: 348 10*3/uL (ref 150–400)
Platelets: 336 10*3/uL (ref 150–400)
Platelets: 356 10*3/uL (ref 150–400)
RBC: 2.75 MIL/uL — ABNORMAL LOW (ref 3.87–5.11)
RBC: 2.99 MIL/uL — ABNORMAL LOW (ref 3.87–5.11)
RBC: 2.78 MIL/uL — AB (ref 3.87–5.11)
RDW: 14.8 % (ref 11.5–15.5)
RDW: 14.5 % (ref 11.5–15.5)
RDW: 14.6 % (ref 11.5–15.5)
WBC: 12.9 10*3/uL — AB (ref 4.0–10.5)
WBC: 32.2 10*3/uL — ABNORMAL HIGH (ref 4.0–10.5)
WBC: 32.3 10*3/uL — AB (ref 4.0–10.5)

## 2016-05-21 SURGERY — Surgical Case
Anesthesia: Epidural | Site: Abdomen

## 2016-05-21 MED ORDER — SODIUM BICARBONATE 8.4 % IV SOLN
INTRAVENOUS | Status: DC | PRN
  Administered 2016-05-21 (×3): 5 mL via EPIDURAL

## 2016-05-21 MED ORDER — KETOROLAC TROMETHAMINE 30 MG/ML IJ SOLN: 30.0000 mg | Freq: Four times a day (QID) | INTRAMUSCULAR | Status: AC | PRN

## 2016-05-21 MED ORDER — SCOPOLAMINE 1 MG/3DAYS TD PT72
MEDICATED_PATCH | TRANSDERMAL | Status: AC
  Filled 2016-05-21: qty 1

## 2016-05-21 MED ORDER — OXYCODONE HCL 5 MG PO TABS: 5.0000 mg | ORAL_TABLET | ORAL | Status: DC | PRN

## 2016-05-21 MED ORDER — MORPHINE SULFATE (PF) 0.5 MG/ML IJ SOLN
INTRAMUSCULAR | Status: AC
  Filled 2016-05-21: qty 10

## 2016-05-21 MED ORDER — DEXTROSE 5 % IV SOLN
3.0000 g | INTRAVENOUS | Status: AC
  Administered 2016-05-21: 3 g via INTRAVENOUS
  Filled 2016-05-21: qty 3000

## 2016-05-21 MED ORDER — ACETAMINOPHEN 500 MG PO TABS
1000.0000 mg | ORAL_TABLET | Freq: Four times a day (QID) | ORAL | Status: AC
  Administered 2016-05-21 – 2016-05-22 (×2): 1000 mg via ORAL
  Filled 2016-05-21 (×2): qty 2

## 2016-05-21 MED ORDER — NALBUPHINE HCL 10 MG/ML IJ SOLN: 5.0000 mg | INTRAMUSCULAR | Status: DC | PRN

## 2016-05-21 MED ORDER — LACTATED RINGERS IV SOLN
INTRAVENOUS | Status: DC
  Administered 2016-05-21: 13:00:00 via INTRAVENOUS

## 2016-05-21 MED ORDER — OXYCODONE HCL 5 MG PO TABS: 10.0000 mg | ORAL_TABLET | ORAL | Status: DC | PRN

## 2016-05-21 MED ORDER — PHENYLEPHRINE 40 MCG/ML (10ML) SYRINGE FOR IV PUSH (FOR BLOOD PRESSURE SUPPORT)
PREFILLED_SYRINGE | INTRAVENOUS | Status: AC
  Filled 2016-05-21: qty 10

## 2016-05-21 MED ORDER — PRENATAL MULTIVITAMIN CH
1.0000 | ORAL_TABLET | Freq: Every day | ORAL | Status: DC
  Administered 2016-05-21 – 2016-05-23 (×3): 1 via ORAL
  Filled 2016-05-21 (×3): qty 1

## 2016-05-21 MED ORDER — OXYTOCIN 40 UNITS IN LACTATED RINGERS INFUSION - SIMPLE MED: 2.5000 [IU]/h | INTRAVENOUS | Status: AC

## 2016-05-21 MED ORDER — ONDANSETRON HCL 4 MG/2ML IJ SOLN
INTRAMUSCULAR | Status: DC | PRN
  Administered 2016-05-21: 4 mg via INTRAVENOUS

## 2016-05-21 MED ORDER — DIBUCAINE 1 % RE OINT: 1.0000 "application " | TOPICAL_OINTMENT | RECTAL | Status: DC | PRN

## 2016-05-21 MED ORDER — OXYTOCIN 10 UNIT/ML IJ SOLN
INTRAMUSCULAR | Status: AC
  Filled 2016-05-21: qty 4

## 2016-05-21 MED ORDER — ONDANSETRON HCL 4 MG/2ML IJ SOLN: 4.0000 mg | Freq: Once | INTRAMUSCULAR | Status: DC | PRN

## 2016-05-21 MED ORDER — COCONUT OIL OIL: 1.0000 "application " | TOPICAL_OIL | Status: DC | PRN

## 2016-05-21 MED ORDER — NALBUPHINE HCL 10 MG/ML IJ SOLN: 5.0000 mg | Freq: Once | INTRAMUSCULAR | Status: DC | PRN

## 2016-05-21 MED ORDER — SODIUM CHLORIDE 0.9 % IR SOLN
Status: DC | PRN
  Administered 2016-05-21: 1000 mL

## 2016-05-21 MED ORDER — DIPHENHYDRAMINE HCL 50 MG/ML IJ SOLN: 12.5000 mg | INTRAMUSCULAR | Status: DC | PRN

## 2016-05-21 MED ORDER — SIMETHICONE 80 MG PO CHEW
80.0000 mg | CHEWABLE_TABLET | ORAL | Status: DC
  Administered 2016-05-22 (×2): 80 mg via ORAL
  Filled 2016-05-21 (×2): qty 1

## 2016-05-21 MED ORDER — SIMETHICONE 80 MG PO CHEW
80.0000 mg | CHEWABLE_TABLET | Freq: Three times a day (TID) | ORAL | Status: DC
  Administered 2016-05-21 – 2016-05-23 (×6): 80 mg via ORAL
  Filled 2016-05-21 (×6): qty 1

## 2016-05-21 MED ORDER — MEPERIDINE HCL 25 MG/ML IJ SOLN
INTRAMUSCULAR | Status: DC | PRN
  Administered 2016-05-21 (×2): 12.5 mg via INTRAVENOUS

## 2016-05-21 MED ORDER — MORPHINE SULFATE (PF) 0.5 MG/ML IJ SOLN
INTRAMUSCULAR | Status: DC | PRN
  Administered 2016-05-21: 3 mg via EPIDURAL

## 2016-05-21 MED ORDER — WITCH HAZEL-GLYCERIN EX PADS: 1.0000 "application " | MEDICATED_PAD | CUTANEOUS | Status: DC | PRN

## 2016-05-21 MED ORDER — ONDANSETRON HCL 4 MG/2ML IJ SOLN
INTRAMUSCULAR | Status: AC
  Filled 2016-05-21: qty 2

## 2016-05-21 MED ORDER — ACETAMINOPHEN 325 MG PO TABS: 650.0000 mg | ORAL_TABLET | ORAL | Status: DC | PRN

## 2016-05-21 MED ORDER — LACTATED RINGERS IV SOLN
INTRAVENOUS | Status: DC | PRN
  Administered 2016-05-21: 04:00:00 via INTRAVENOUS

## 2016-05-21 MED ORDER — DIPHENHYDRAMINE HCL 25 MG PO CAPS: 25.0000 mg | ORAL_CAPSULE | ORAL | Status: DC | PRN

## 2016-05-21 MED ORDER — SCOPOLAMINE 1 MG/3DAYS TD PT72
1.0000 | MEDICATED_PATCH | Freq: Once | TRANSDERMAL | Status: DC
  Filled 2016-05-21: qty 1

## 2016-05-21 MED ORDER — OXYTOCIN 10 UNIT/ML IJ SOLN
INTRAVENOUS | Status: DC | PRN
  Administered 2016-05-21: 40 [IU] via INTRAVENOUS

## 2016-05-21 MED ORDER — NALOXONE HCL 0.4 MG/ML IJ SOLN: 0.4000 mg | INTRAMUSCULAR | Status: DC | PRN

## 2016-05-21 MED ORDER — MORPHINE SULFATE-NACL 0.5-0.9 MG/ML-% IV SOSY
PREFILLED_SYRINGE | INTRAVENOUS | Status: AC
  Filled 2016-05-21: qty 1

## 2016-05-21 MED ORDER — IBUPROFEN 600 MG PO TABS
600.0000 mg | ORAL_TABLET | Freq: Four times a day (QID) | ORAL | Status: DC
  Administered 2016-05-21 – 2016-05-23 (×8): 600 mg via ORAL
  Filled 2016-05-21 (×8): qty 1

## 2016-05-21 MED ORDER — SENNOSIDES-DOCUSATE SODIUM 8.6-50 MG PO TABS
2.0000 | ORAL_TABLET | ORAL | Status: DC
  Administered 2016-05-22 (×2): 2 via ORAL
  Filled 2016-05-21 (×2): qty 2

## 2016-05-21 MED ORDER — SODIUM CHLORIDE 0.9% FLUSH: 3.0000 mL | INTRAVENOUS | Status: DC | PRN

## 2016-05-21 MED ORDER — DIPHENHYDRAMINE HCL 25 MG PO CAPS: 25.0000 mg | ORAL_CAPSULE | Freq: Four times a day (QID) | ORAL | Status: DC | PRN

## 2016-05-21 MED ORDER — FENTANYL CITRATE (PF) 100 MCG/2ML IJ SOLN: 25.0000 ug | INTRAMUSCULAR | Status: DC | PRN

## 2016-05-21 MED ORDER — SCOPOLAMINE 1 MG/3DAYS TD PT72
MEDICATED_PATCH | TRANSDERMAL | Status: DC | PRN
  Administered 2016-05-21: 1 via TRANSDERMAL

## 2016-05-21 MED ORDER — MEPERIDINE HCL 25 MG/ML IJ SOLN: 6.2500 mg | INTRAMUSCULAR | Status: DC | PRN

## 2016-05-21 MED ORDER — MENTHOL 3 MG MT LOZG: 1.0000 | LOZENGE | OROMUCOSAL | Status: DC | PRN

## 2016-05-21 MED ORDER — TETANUS-DIPHTH-ACELL PERTUSSIS 5-2.5-18.5 LF-MCG/0.5 IM SUSP: 0.5000 mL | Freq: Once | INTRAMUSCULAR | Status: DC

## 2016-05-21 MED ORDER — NALOXONE HCL 2 MG/2ML IJ SOSY
1.0000 ug/kg/h | PREFILLED_SYRINGE | INTRAVENOUS | Status: DC | PRN
  Filled 2016-05-21: qty 2

## 2016-05-21 MED ORDER — DEXAMETHASONE SODIUM PHOSPHATE 4 MG/ML IJ SOLN
INTRAMUSCULAR | Status: AC
  Filled 2016-05-21: qty 1

## 2016-05-21 MED ORDER — ONDANSETRON HCL 4 MG/2ML IJ SOLN
4.0000 mg | Freq: Three times a day (TID) | INTRAMUSCULAR | Status: DC | PRN
  Administered 2016-05-21: 4 mg via INTRAVENOUS
  Filled 2016-05-21 (×2): qty 2

## 2016-05-21 MED ORDER — DEXAMETHASONE SODIUM PHOSPHATE 4 MG/ML IJ SOLN
INTRAMUSCULAR | Status: DC | PRN
  Administered 2016-05-21: 4 mg via INTRAVENOUS

## 2016-05-21 MED ORDER — MEPERIDINE HCL 25 MG/ML IJ SOLN
INTRAMUSCULAR | Status: AC
  Filled 2016-05-21: qty 1

## 2016-05-21 MED ORDER — SIMETHICONE 80 MG PO CHEW: 80.0000 mg | CHEWABLE_TABLET | ORAL | Status: DC | PRN

## 2016-05-21 MED ORDER — PHENYLEPHRINE HCL 10 MG/ML IJ SOLN
INTRAMUSCULAR | Status: DC | PRN
  Administered 2016-05-21 (×2): 80 ug via INTRAVENOUS
  Administered 2016-05-21: 120 ug via INTRAVENOUS
  Administered 2016-05-21: 80 ug via INTRAVENOUS

## 2016-05-21 SURGICAL SUPPLY — 31 items
CHLORAPREP W/TINT 26ML (MISCELLANEOUS) ×3 IMPLANT
CLAMP CORD UMBIL (MISCELLANEOUS) IMPLANT
CONTAINER PREFILL 10% NBF 15ML (MISCELLANEOUS) IMPLANT
DRESSING DISP NPWT PICO 4X12 (MISCELLANEOUS) ×3 IMPLANT
DRSG OPSITE POSTOP 4X10 (GAUZE/BANDAGES/DRESSINGS) ×3 IMPLANT
ELECT REM PT RETURN 9FT ADLT (ELECTROSURGICAL) ×3
ELECTRODE REM PT RTRN 9FT ADLT (ELECTROSURGICAL) ×1 IMPLANT
EXTRACTOR VACUUM M CUP 4 TUBE (SUCTIONS) IMPLANT
EXTRACTOR VACUUM M CUP 4' TUBE (SUCTIONS)
GLOVE BIOGEL PI IND STRL 6.5 (GLOVE) ×1 IMPLANT
GLOVE BIOGEL PI IND STRL 7.0 (GLOVE) ×3 IMPLANT
GLOVE BIOGEL PI INDICATOR 6.5 (GLOVE) ×2
GLOVE BIOGEL PI INDICATOR 7.0 (GLOVE) ×6
GLOVE SURG SS PI 6.0 STRL IVOR (GLOVE) ×3 IMPLANT
GLOVE SURG SS PI 7.0 STRL IVOR (GLOVE) ×3 IMPLANT
GOWN STRL REUS W/TWL LRG LVL3 (GOWN DISPOSABLE) ×6 IMPLANT
KIT ABG SYR 3ML LUER SLIP (SYRINGE) IMPLANT
NEEDLE HYPO 25X5/8 SAFETYGLIDE (NEEDLE) IMPLANT
NS IRRIG 1000ML POUR BTL (IV SOLUTION) ×3 IMPLANT
PACK C SECTION WH (CUSTOM PROCEDURE TRAY) ×3 IMPLANT
PAD OB MATERNITY 4.3X12.25 (PERSONAL CARE ITEMS) ×3 IMPLANT
PENCIL SMOKE EVAC W/HOLSTER (ELECTROSURGICAL) ×3 IMPLANT
RTRCTR C-SECT PINK 25CM LRG (MISCELLANEOUS) ×3 IMPLANT
SEPRAFILM MEMBRANE 5X6 (MISCELLANEOUS) IMPLANT
SPONGE LAP 18X18 X RAY DECT (DISPOSABLE) ×6 IMPLANT
SUT PLAIN 0 NONE (SUTURE) IMPLANT
SUT PLAIN 2 0 XLH (SUTURE) ×3 IMPLANT
SUT VIC AB 0 CT1 36 (SUTURE) ×15 IMPLANT
SUT VIC AB 4-0 KS 27 (SUTURE) ×3 IMPLANT
TOWEL OR 17X24 6PK STRL BLUE (TOWEL DISPOSABLE) ×6 IMPLANT
TRAY FOLEY CATH SILVER 14FR (SET/KITS/TRAYS/PACK) IMPLANT

## 2016-05-21 NOTE — Lactation Note (Signed)
This note was copied from a baby's chart. Lactation Consultation Note  P1, Baby 7 hours old.  Mother would like to breastfeed and give formula. Reviewed hand expression and mother was easily able to express flow of colostrum. Discussed depth, supply and demand and encouraged breastfeeding before offering formula. Mom encouraged to feed baby 8-12 times/24 hours and with feeding cues.  Mom made aware of O/P services, breastfeeding support groups, community resources, and our phone # for post-discharge questions.  Suggest she call if she would like assistance w/ latching.    Patient Name: Joann Pearson [id number]'s Date: 05/21/2016 Reason for consult: Initial assessment   Maternal Data Has patient been taught Hand Expression?: Yes Does the patient have breastfeeding experience prior to this delivery?: No  Feeding Feeding Type: Formula Nipple Type: Slow - flow  LATCH Score/Interventions                      Lactation Tools Discussed/Used     Consult Status Consult Status: Follow-up Date: 05/22/16 Follow-up type: In-patient    Dahlia ByesBerkelhammer, Ruth [facility] 05/21/2016, 10:47 AM

## 2016-05-21 NOTE — Consult Note (Signed)
Neonatology Note:   Attendance at C-section:    I was asked by Dr. Constant to attend this primary C/S at term after FTP with NRFHT. The mother is a Gneg, GBS neg with good prenatal care. ROM 16 hours before delivery, fluid meconium. Infant vigorous with good spontaneous cry and tone. Needed only minimal bulb suctioning. Ap 8/9. Lungs clear to ausc in DR. To CN to care of Pediatrician.  David C. Ehrmann, MD 

## 2016-05-21 NOTE — Op Note (Signed)
Joann Pearson PROCEDURE DATE: 05/19/2016 - 05/21/2016  PREOPERATIVE DIAGNOSIS: Intrauterine pregnancy at  5755w2d weeks gestation; non-reassuring fetal status  POSTOPERATIVE DIAGNOSIS: The same  PROCEDURE:     Cesarean Section  SURGEON:  Dr. Gigi GinPeggy Rayneisha Bouza  ASSISTANT: none  INDICATIONS: Joann Pearson is a 25 y.o. G1P0 at 3255w2d scheduled for cesarean section secondary to non-reassuring fetal status.  The risks of cesarean section discussed with the patient included but were not limited to: bleeding which may require transfusion or reoperation; infection which may require antibiotics; injury to bowel, bladder, ureters or other surrounding organs; injury to the fetus; need for additional procedures including hysterectomy in the event of a life-threatening hemorrhage; placental abnormalities wth subsequent pregnancies, incisional problems, thromboembolic phenomenon and other postoperative/anesthesia complications. The patient concurred with the proposed plan, giving informed written consent for the procedure.    FINDINGS:  Viable female infant in cephalic presentation.  Apgars 8 and 9.  Thick meconium stained amniotic fluid.  Intact placenta, three vessel cord.  Normal uterus, fallopian tubes and ovaries bilaterally.  ANESTHESIA:    Spinal INTRAVENOUS FLUIDS:3000 ml ESTIMATED BLOOD LOSS: 1300 ml URINE OUTPUT:  150 ml SPECIMENS: Placenta sent to L&D COMPLICATIONS: None immediate  PROCEDURE IN DETAIL:  The patient received intravenous antibiotics and had sequential compression devices applied to her lower extremities while in the preoperative area.  She was then taken to the operating room where anesthesia was induced and was found to be adequate. A foley catheter was placed into her bladder and attached to Joann Pearson gravity. She was then placed in a dorsal supine position with a leftward tilt, and prepped and draped in a sterile manner. After an adequate timeout was performed, a Pfannenstiel skin incision was  made with scalpel and carried through to the underlying layer of fascia. The fascia was incised in the midline and this incision was extended bilaterally using the Mayo scissors. Kocher clamps were applied to the superior aspect of the fascial incision and the underlying rectus muscles were dissected off bluntly. A similar process was carried out on the inferior aspect of the facial incision. The rectus muscles were separated in the midline bluntly and the peritoneum was entered bluntly. The Alexis self-retaining retractor was introduced into the abdominal cavity. Attention was turned to the lower uterine segment where a transverse hysterotomy was made with a scalpel and extended bilaterally bluntly. The infant was successfully delivered, delayed cord clamping was performed for 1 minute. The cord was clamped and cut and infant was handed over to awaiting neonatology team. Uterine massage was then administered and the placenta delivered intact with three-vessel cord. The uterus was cleared of clot and debris.  The hysterotomy was closed with 0 Vicryl in a running locked fashion, and an imbricating layer was also placed with a 0 Vicryl. Overall, excellent hemostasis was noted. The pelvis copiously irrigated and cleared of all clot and debris. Hemostasis was confirmed on all surfaces.  The peritoneum and the muscles were reapproximated using 0 vicryl interrupted stitches. The fascia was then closed using 0 Vicryl in a running fashion.  The subcutaneous layer was reapproximated with plain gut and the skin was closed in a subcuticular fashion using 3.0 Vicryl. The patient tolerated the procedure well. Sponge, lap, instrument and needle counts were correct x 2. A PICO disposable wound vac was applied over the incision. She was taken to the recovery room in stable condition.    Joann Pearson,PEGGYMD  05/21/2016 4:23 AM

## 2016-05-21 NOTE — Progress Notes (Signed)
Patient ID: Joann Pearson, female   DOB: March 24, 1991, 25 y.o.   MRN: [id number]  Called to evaluate patient with change in fetal heart tracing. Patient is feeling well without complaints, comfortable with epidural.   FHT: baseline 150, min variability, occasional late decels, no accels Toco: contractions q 3-5 minutes SVE 8/100/-2  25 yo G1P0 at 341 w in active labor now with non reassuring fetal heart tracing.  - Intrauterine resuscitation were undertaken without improvement - Discussed delivery via cesarean section. Risks, benefits and alternatives were explained including but not limited to risks of bleeding, infection and damage to adjacent organs - Patient verbalized understanding and all questions were answered. Consent signed

## 2016-05-21 NOTE — Anesthesia Postprocedure Evaluation (Signed)
Anesthesia Post Note  Patient: Joann Pearson  Procedure(s) Performed: Procedure(s) (LRB): CESAREAN SECTION (N/A)  Patient location during evaluation: Mother Baby Anesthesia Type: Epidural Level of consciousness: awake and alert Pain management: pain level controlled Vital Signs Assessment: post-procedure vital signs reviewed and stable Respiratory status: spontaneous breathing, nonlabored ventilation and respiratory function stable Cardiovascular status: stable Postop Assessment: no headache, no backache and epidural receding Anesthetic complications: no     Last Vitals:  Vitals:   05/21/16 0530 05/21/16 0545  BP: 122/76 118/85  Pulse: (!) 106 93  Resp: 18 (!) 31  Temp:  36.9 C    Last Pain:  Vitals:   05/21/16 0445  TempSrc:   PainSc: 0-No pain   Pain Goal: Patients Stated Pain Goal: 7 (05/20/16 1904)               [name]

## 2016-05-21 NOTE — Addendum Note (Signed)
Addendum  created 05/21/16 0739 by Shantea Poulton H Tziporah Knoke, CRNA   Sign clinical note    

## 2016-05-21 NOTE — Anesthesia Postprocedure Evaluation (Signed)
Anesthesia Post Note  Patient: Berline Azzaro  Procedure(s) Performed: Procedure(s) (LRB): CESAREAN SECTION (N/A)  Patient location during evaluation: Mother Baby Anesthesia Type: Epidural Level of consciousness: awake and alert Pain management: pain level controlled Vital Signs Assessment: post-procedure vital signs reviewed and stable Respiratory status: spontaneous breathing and nonlabored ventilation Cardiovascular status: stable Postop Assessment: no headache, no backache, patient able to bend at knees, epidural receding, no signs of nausea or vomiting and adequate PO intake Anesthetic complications: no     Last Vitals:  Vitals:   05/21/16 0610 05/21/16 0625  BP: (!) 124/59 115/61  Pulse: (!) 104 (!) 106  Resp: 20 18  Temp: 36.9 C 37.1 C    Last Pain:  Vitals:   05/21/16 0625  TempSrc: Oral  PainSc: 0-No pain   Pain Goal: Patients Stated Pain Goal: 7 (05/20/16 1904)               Laban EmperorMalinova,Nicklas Mcsweeney Hristova

## 2016-05-21 NOTE — Transfer of Care (Signed)
Immediate Anesthesia Transfer of Care Note  Patient: Joann Pearson  Procedure(s) Performed: Procedure(s): CESAREAN SECTION (N/A)  Patient Location: PACU  Anesthesia Type:Epidural  Level of Consciousness: awake, alert  and oriented  Airway & Oxygen Therapy: Patient Spontanous Breathing  Post-op Assessment: Report given to RN and Post -op Vital signs reviewed and stable  Post vital signs: Reviewed and stable  Last Vitals:  Vitals:   05/21/16 0246 05/21/16 0300  BP:    Pulse:    Resp:  18  Temp: 37.9 C     Last Pain:  Vitals:   05/21/16 0246  TempSrc: Axillary  PainSc:       Patients Stated Pain Goal: 7 (05/20/16 1904)  Complications: No apparent anesthesia complications

## 2016-05-22 LAB — BIRTH TISSUE RECOVERY COLLECTION (PLACENTA DONATION)

## 2016-05-22 NOTE — Progress Notes (Signed)
Subjective: Postoperative Day 1: Cesarean Delivery Patient reports tolerating PO, + flatus and no problems voiding.    Objective: Vital signs in last 24 hours: Temp:  [97.6 F (36.4 C)-98.7 F (37.1 C)] 97.6 F (36.4 C) (09/19 0555) Pulse Rate:  [72-116] 72 (09/19 0555) Resp:  [18-20] 18 (09/19 0555) BP: (98-120)/(50-66) 108/53 (09/19 0555) SpO2:  [96 %-99 %] 99 % (09/18 2325)  Physical Exam:  General: alert, cooperative and no distress Lochia: appropriate Uterine Fundus: firm Incision: healing well, no significant drainage, no dehiscence, no significant erythema DVT Evaluation: No evidence of DVT seen on physical exam. Negative Homan's sign.   Recent Labs  05/21/16 0748 05/21/16 1710  HGB 9.1* 8.5*  HCT 26.8* 25.2*    Assessment/Plan: Status post Cesarean section. Doing well postoperatively.  Continue current care.  Leland HerElsia J Yoo PGY-1 05/22/2016, 7:40 AM  OB FELLOW POSTPARTUM PROGRESS NOTE ATTESTATION  I have seen and examined this patient and agree with above documentation in the resident's note.   Ernestina PennaNicholas Schenk, MD 7:47 AM

## 2016-05-22 NOTE — Progress Notes (Signed)
Security called to room due to shouting between patient and S.O.  Patient stated there was no problem and everything was fine.

## 2016-05-23 MED ORDER — IBUPROFEN 600 MG PO TABS: 600.0000 mg | ORAL_TABLET | Freq: Four times a day (QID) | ORAL | 0 refills | Status: DC

## 2016-05-23 MED ORDER — SENNOSIDES-DOCUSATE SODIUM 8.6-50 MG PO TABS: 2.0000 | ORAL_TABLET | ORAL | 0 refills | Status: DC

## 2016-05-23 MED ORDER — OXYCODONE HCL 5 MG PO TABS: 5.0000 mg | ORAL_TABLET | ORAL | 0 refills | Status: DC | PRN

## 2016-05-23 NOTE — Discharge Summary (Signed)
OB Discharge Summary     Patient Name: Joann Pearson DOB: [birth year] MRN: [id number]  Date of admission: 05/19/2016 Delivering MD: Catalina Antigua   Date of discharge: 05/23/2016  Admitting diagnosis: INDUCTION Intrauterine pregnancy: [redacted]w[redacted]d     Secondary diagnosis:  Principal Problem:   Post term pregnancy, 41 weeks Active Problems:   Cesarean delivery delivered  Additional problems: none     Discharge diagnosis: Term Pregnancy Delivered                                                                                                Post partum procedures:none  Augmentation: AROM, Pitocin, Cytotec and Foley Balloon  Complications: None  Hospital course:  Induction of Labor With Cesarean Section  25 y.o. yo G1P0 at [redacted]w[redacted]d was admitted to the hospital 05/19/2016 for induction of labor. Patient had a labor course significant for NRFHT. The patient went for cesarean section due to Non-Reassuring FHR, and delivered a Viable infant,@BABYSUPPRESS (DBLINK,ept,110,,1,,) Membrane Rupture Time/Date: )1:50 PM ,05/20/2016   @Details  of operation can be found in separate operative Note.  Patient had an uncomplicated postpartum course. She is ambulating, tolerating a regular diet, passing flatus, and urinating well.  Patient is discharged home in stable condition on 05/23/16.                                     Physical exam Vitals:   05/22/16 0350 05/22/16 0555 05/22/16 1800 05/23/16 0513  BP: 99/66 (!) 108/53 112/61 126/66  Pulse: 87 72 97 85  Resp: 18 18 18 18   Temp: 98.1 F (36.7 C) 97.6 F (36.4 C) 97.8 F (36.6 C) 98 F (36.7 C)  TempSrc: Oral  Oral Oral  SpO2:      Weight:      Height:       General: alert, cooperative and no distress Lochia: appropriate Uterine Fundus: firm Incision: Healing well with no significant drainage, No significant erythema, Dressing is clean, dry, and intact, PICO in place DVT Evaluation: No evidence of DVT seen on physical exam. Negative Homan's  sign. Labs: Lab Results  Component Value Date   WBC 32.3 (H) 05/21/2016   HGB 8.5 (L) 05/21/2016   HCT 25.2 (L) 05/21/2016   MCV 90.6 05/21/2016   PLT 348 05/21/2016   No flowsheet data found.  Discharge instruction: per After Visit Summary and "Baby and Me Booklet".  After visit meds:    Medication List    TAKE these medications   DHA PO Take 1 tablet by mouth daily. Reported on 02/22/2016   ibuprofen 600 MG tablet Commonly known as:  ADVIL,MOTRIN Take 1 tablet (600 mg total) by mouth every 6 (six) hours.   multivitamin-prenatal 27-0.8 MG Tabs tablet Take 1 tablet by mouth daily at 12 noon.   oxyCODONE 5 MG immediate release tablet Commonly known as:  Oxy IR/ROXICODONE Take 1 tablet (5 mg total) by mouth every 4 (four) hours as needed (pain scale 4-7).   senna-docusate 8.6-50 MG tablet Commonly known as:  Senokot-S Take  2 tablets by mouth daily. Start taking on:  05/24/2016       Diet: routine diet  Activity: Advance as tolerated. Pelvic rest for 6 weeks.   Outpatient follow up:6 weeks Follow up Appt:Future Appointments Date Time Provider Department Center  06/20/2016 3:20 PM Aviva SignsMarie L Williams, CNM [facility]   Follow up Visit: Follow-up Information    [facility]   Specialty:  Obstetrics and Gynecology Why:  postpartum visit Contact information: [address] [place] [id number] [phone]         Postpartum contraception: nuvaring  Newborn Data: Live born female  Birth Weight: 7 lb 2.3 oz (3240 g) APGAR: 8, 9  Baby Feeding: Bottle and Breast Disposition:home with mother   05/23/2016 Leland HerElsia J Yoo, DO PGY1  OB FELLOW DISCHARGE ATTESTATION  I have seen and examined this patient and agree with above documentation in the resident's note.   Ernestina PennaNicholas Schenk, MD 12:02 PM

## 2016-05-23 NOTE — Progress Notes (Signed)
Pt verbalizes understanding of d/c instructions, medications, follow up appts, when to seek medical attention, and belongings policy. Pt has no questions at this time. Pt has no IV at this time. Pt is getting things packed to go. Sheryn BisonGordon, Ailyn Gladd Warner

## 2016-05-23 NOTE — Progress Notes (Signed)
Pt escorted via wheelchair to main entrance by RN. No questions at this time. Family is with her and will be driving her home. Joann Pearson, Joann Pearson

## 2016-05-23 NOTE — Discharge Instructions (Signed)
Cesarean Delivery, Care After °Refer to this sheet in the next few weeks. These instructions provide you with information on caring for yourself after your procedure. Your health care provider may also give you specific instructions. Your treatment has been planned according to current medical practices, but problems sometimes occur. Call your health care provider if you have any problems or questions after you go home. °HOME CARE INSTRUCTIONS  °· Only take over-the-counter or prescription medications as directed by your health care provider. °· Do not drink alcohol, especially if you are breastfeeding or taking medication to relieve pain. °· Do not chew or smoke tobacco. °· Continue to use good perineal care. Good perineal care includes: °· Wiping your perineum from front to back. °· Keeping your perineum clean. °· Check your surgical cut (incision) daily for increased redness, drainage, swelling, or separation of skin. °· Clean your incision gently with soap and water every day, and then pat it dry. If your health care provider says it is okay, leave the incision uncovered. Use a bandage (dressing) if the incision is draining fluid or appears irritated. If the adhesive strips across the incision do not fall off within 7 days, carefully peel them off. °· Hug a pillow when coughing or sneezing until your incision is healed. This helps to relieve pain. °· Do not use tampons or douche until your health care provider says it is okay. °· Shower, wash your hair, and take tub baths as directed by your health care provider. °· Wear a well-fitting bra that provides breast support. °· Limit wearing support panties or control-top hose. °· Drink enough fluids to keep your urine clear or pale yellow. °· Eat high-fiber foods such as whole grain cereals and breads, brown rice, beans, and fresh fruits and vegetables every day. These foods may help prevent or relieve constipation. °· Resume activities such as climbing stairs,  driving, lifting, exercising, or traveling as directed by your health care provider. °· Talk to your health care provider about resuming sexual activities. This is dependent upon your risk of infection, your rate of healing, and your comfort and desire to resume sexual activity. °· Try to have someone help you with your household activities and your newborn for at least a few days after you leave the hospital. °· Rest as much as possible. Try to rest or take a nap when your newborn is sleeping. °· Increase your activities gradually. °· Keep all of your scheduled postpartum appointments. It is very important to keep your scheduled follow-up appointments. At these appointments, your health care provider will be checking to make sure that you are healing physically and emotionally. °SEEK MEDICAL CARE IF:  °· You are passing large clots from your vagina. Save any clots to show your health care provider. °· You have a foul smelling discharge from your vagina. °· You have trouble urinating. °· You are urinating frequently. °· You have pain when you urinate. °· You have a change in your bowel movements. °· You have increasing redness, pain, or swelling near your incision. °· You have pus draining from your incision. °· Your incision is separating. °· You have painful, hard, or reddened breasts. °· You have a severe headache. °· You have blurred vision or see spots. °· You feel sad or depressed. °· You have thoughts of hurting yourself or your newborn. °· You have questions about your care, the care of your newborn, or medications. °· You are dizzy or light-headed. °· You have a rash. °· You   have pain, redness, or swelling at the site of the removed intravenous access (IV) tube. °· You have nausea or vomiting. °· You stopped breastfeeding and have not had a menstrual period within 12 weeks of stopping. °· You are not breastfeeding and have not had a menstrual period within 12 weeks of delivery. °· You have a fever. °SEEK  IMMEDIATE MEDICAL CARE IF: °· You have persistent pain. °· You have chest pain. °· You have shortness of breath. °· You faint. °· You have leg pain. °· You have stomach pain. °· Your vaginal bleeding saturates 2 or more sanitary pads in 1 hour. °MAKE SURE YOU:  °· Understand these instructions. °· Will watch your condition. °· Will get help right away if you are not doing well or get worse. °  °This information is not intended to replace advice given to you by your health care provider. Make sure you discuss any questions you have with your health care provider. °  °Document Released: 05/12/2002 Document Revised: 09/10/2014 Document Reviewed: 04/16/2012 °Elsevier Interactive Patient Education ©2016 [employer] ° ° °Iron-Rich Diet ° °Iron is a mineral that helps your body to produce hemoglobin. Hemoglobin is a protein in your red blood cells that carries oxygen to your body's tissues. Eating too little iron may cause you to feel weak and tired, and it can increase your risk for infection. Eating enough iron is necessary for your body's metabolism, muscle function, and nervous system. °Iron is naturally found in many foods. It can also be added to foods or fortified in foods. There are two types of dietary iron: °· Heme iron. Heme iron is absorbed by the body more easily than nonheme iron. Heme iron is found in meat, poultry, and fish. °· Nonheme iron. Nonheme iron is found in dietary supplements, iron-fortified grains, beans, and vegetables. °You may need to follow an iron-rich diet if: °· You have been diagnosed with iron deficiency or iron-deficiency anemia. °· You have a condition that prevents you from absorbing dietary iron, such as: °¨ Infection in your intestines. °¨ Celiac disease. This involves long-lasting (chronic) inflammation of your intestines. °· You do not eat enough iron. °· You eat a diet that is high in foods that impair iron absorption. °· You have lost a lot of blood. °· You have heavy bleeding  during your menstrual cycle. °· You are pregnant. °WHAT IS MY PLAN? °Your health care provider may help you to determine how much iron you need per day based on your condition. Generally, when a person consumes sufficient amounts of iron in the diet, the following iron needs are met: °· Men. °¨ 14-18 years old: 11 mg per day. °¨ 19-50 years old: 8 mg per day. °· Women.   °¨ 14-18 years old: 15 mg per day. °¨ 19-50 years old: 18 mg per day. °¨ Over 50 years old: 8 mg per day. °¨ Pregnant women: 27 mg per day. °¨ Breastfeeding women: 9 mg per day. °WHAT DO I NEED TO KNOW ABOUT AN IRON-RICH DIET? °· Eat fresh fruits and vegetables that are high in vitamin C along with foods that are high in iron. This will help increase the amount of iron that your body absorbs from food, especially with foods containing nonheme iron. Foods that are high in vitamin C include oranges, peppers, tomatoes, and mango. °· Take iron supplements only as directed by your health care provider. Overdose of iron can be life-threatening. If you were prescribed iron supplements, take them with orange   juice or a vitamin C supplement. °· Cook foods in pots and pans that are made from iron.   °· Eat nonheme iron-containing foods alongside foods that are high in heme iron. This helps to improve your iron absorption.   °· Certain foods and drinks contain compounds that impair iron absorption. Avoid eating these foods in the same meal as iron-rich foods or with iron supplements. These include: °¨ Coffee, black tea, and red wine. °¨ Milk, dairy products, and foods that are high in calcium. °¨ Beans, soybeans, and peas. °¨ Whole grains. °· When eating foods that contain both nonheme iron and compounds that impair iron absorption, follow these tips to absorb iron better.   °¨ Soak beans overnight before cooking. °¨ Soak whole grains overnight and drain them before using. °¨ Ferment flours before baking, such as using yeast in bread dough. °WHAT FOODS CAN I  EAT? °Grains  °Iron-fortified breakfast cereal. Iron-fortified whole-wheat bread. Enriched rice. Sprouted grains. °Vegetables  °Spinach. Potatoes with skin. Green peas. Broccoli. Red and green bell peppers. Fermented vegetables. °Fruits  °Prunes. Raisins. Oranges. Strawberries. Mango. Grapefruit. °Meats and Other Protein Sources  °Beef liver. Oysters. Beef. Shrimp. Turkey. Chicken. Tuna. Sardines. Chickpeas. Nuts. Tofu. °Beverages  °Tomato juice. Fresh orange juice. Prune juice. Hibiscus tea. Fortified instant breakfast shakes. °Condiments  °Tahini. Fermented soy sauce.  °Sweets and Desserts  °Black-strap molasses.  °Other  °Wheat germ. °The items listed above may not be a complete list of recommended foods or beverages. Contact your dietitian for more options.  °WHAT FOODS ARE NOT RECOMMENDED? °Grains  °Whole grains. Bran cereal. Bran flour. Oats. °Vegetables  °Artichokes. Brussels sprouts. Kale. °Fruits  °Blueberries. Raspberries. Strawberries. Figs. °Meats and Other Protein Sources  °Soybeans. Products made from soy protein. °Dairy  °Milk. Cream. Cheese. Yogurt. Cottage cheese. °Beverages  °Coffee. Black tea. Red wine. °Sweets and Desserts  °Cocoa. Chocolate. Ice cream. °Other  °Basil. Oregano. Parsley. °The items listed above may not be a complete list of foods and beverages to avoid. Contact your dietitian for more information.  °  °This information is not intended to replace advice given to you by your health care provider. Make sure you discuss any questions you have with your health care provider. °  °Document Released: 04/03/2005 Document Revised: 09/10/2014 Document Reviewed: 03/17/2014 °Elsevier Interactive Patient Education ©2016 [employer] ° °

## 2016-06-20 ENCOUNTER — Ambulatory Visit (INDEPENDENT_AMBULATORY_CARE_PROVIDER_SITE_OTHER): Payer: BC Managed Care – PPO | Admitting: Family Medicine

## 2016-06-20 ENCOUNTER — Encounter: Payer: Self-pay | Admitting: Family Medicine

## 2016-06-20 DIAGNOSIS — Z3202 Encounter for pregnancy test, result negative: Secondary | ICD-10-CM

## 2016-06-20 DIAGNOSIS — Z30430 Encounter for insertion of intrauterine contraceptive device: Secondary | ICD-10-CM | POA: Diagnosis not present

## 2016-06-20 LAB — POCT PREGNANCY, URINE: PREG TEST UR: NEGATIVE

## 2016-06-20 MED ORDER — LEVONORGESTREL 18.6 MCG/DAY IU IUD
INTRAUTERINE_SYSTEM | Freq: Once | INTRAUTERINE | Status: AC
  Administered 2016-06-20: 1 via INTRAUTERINE

## 2016-06-20 NOTE — Progress Notes (Signed)
Subjective:     Joann Pearson is a 25 y.o. female who presents for a postpartum visit. She is 4 weeks postpartum following a low cervical transverse Cesarean section. I have fully reviewed the prenatal and intrapartum course. The delivery was at 41 gestational weeks. Outcome: primary cesarean section, low transverse incision. Anesthesia: spinal. Postpartum course has been normal. Baby's course has been normal. Baby is feeding by bottle - Similac with Iron. Bleeding no bleeding. Bowel function is normal. Bladder function is normal. Patient is not sexually active. Contraception method is IUD. Postpartum depression screening: negative.  The following portions of the patient's history were reviewed and updated as appropriate: allergies, current medications, past family history, past medical history, past social history, past surgical history and problem list.  Review of Systems Pertinent items noted in HPI and remainder of comprehensive ROS otherwise negative.   Objective:    LMP 08/06/2015 (Exact Date)   General:  alert, cooperative and no distress  Lungs: clear to auscultation bilaterally  Heart:  regular rate and rhythm, S1, S2 normal, no murmur, click, rub or gallop  Abdomen: soft, non-tender; bowel sounds normal; no masses,  no organomegaly   Vulva:  normal  Vagina: normal vagina, no discharge, exudate, lesion, or erythema  Cervix:  multiparous appearance        Assessment:     normal postpartum exam. Pap smear not done at today's visit.   Plan:    1. Contraception: IUD - inserted today 2. Follow up in: 1 month or as needed.     IUD Procedure Note Patient identified, informed consent performed, signed copy in chart, time out was performed.  Urine pregnancy test negative.  Speculum placed in the vagina.  Cervix visualized.  Cleaned with Betadine x 2.  Grasped anteriorly with a single tooth tenaculum.  Uterus sounded to 10 cm.  Liletta  IUD placed per manufacturer's recommendations.   Strings trimmed to 3 cm. Tenaculum was removed, good hemostasis noted.  Patient tolerated procedure well.   Patient given post procedure instructions and Liletta care card with expiration date.  Patient is asked to check IUD strings periodically and follow up in 4-6 weeks for IUD check.

## 2016-06-20 NOTE — Patient Instructions (Signed)
Levonorgestrel intrauterine device (IUD) What is this medicine? LEVONORGESTREL IUD (LEE voe nor jes trel) is a contraceptive (birth control) device. The device is placed inside the uterus by a healthcare professional. It is used to prevent pregnancy and can also be used to treat heavy bleeding that occurs during your period. Depending on the device, it can be used for 3 to 5 years. This medicine may be used for other purposes; ask your health care provider or pharmacist if you have questions. What should I tell my health care provider before I take this medicine? They need to know if you have any of these conditions: -abnormal Pap smear -cancer of the breast, uterus, or cervix -diabetes -endometritis -genital or pelvic infection now or in the past -have more than one sexual partner or your partner has more than one partner -heart disease -history of an ectopic or tubal pregnancy -immune system problems -IUD in place -liver disease or tumor -problems with blood clots or take blood-thinners -use intravenous drugs -uterus of unusual shape -vaginal bleeding that has not been explained -an unusual or allergic reaction to levonorgestrel, other hormones, silicone, or polyethylene, medicines, foods, dyes, or preservatives -pregnant or trying to get pregnant -breast-feeding How should I use this medicine? This device is placed inside the uterus by a health care professional. Talk to your pediatrician regarding the use of this medicine in children. Special care may be needed. Overdosage: If you think you have taken too much of this medicine contact a poison control center or emergency room at once. NOTE: This medicine is only for you. Do not share this medicine with others. What if I miss a dose? This does not apply. What may interact with this medicine? Do not take this medicine with any of the following medications: -amprenavir -bosentan -fosamprenavir This medicine may also interact with  the following medications: -aprepitant -barbiturate medicines for inducing sleep or treating seizures -bexarotene -griseofulvin -medicines to treat seizures like carbamazepine, ethotoin, felbamate, oxcarbazepine, phenytoin, topiramate -modafinil -pioglitazone -rifabutin -rifampin -rifapentine -some medicines to treat HIV infection like atazanavir, indinavir, lopinavir, nelfinavir, tipranavir, ritonavir -St. John's wort -warfarin This list may not describe all possible interactions. Give your health care provider a list of all the medicines, herbs, non-prescription drugs, or dietary supplements you use. Also tell them if you smoke, drink alcohol, or use illegal drugs. Some items may interact with your medicine. What should I watch for while using this medicine? Visit your doctor or health care professional for regular check ups. See your doctor if you or your partner has sexual contact with others, becomes HIV positive, or gets a sexual transmitted disease. This product does not protect you against HIV infection (AIDS) or other sexually transmitted diseases. You can check the placement of the IUD yourself by reaching up to the top of your vagina with clean fingers to feel the threads. Do not pull on the threads. It is a good habit to check placement after each menstrual period. Call your doctor right away if you feel more of the IUD than just the threads or if you cannot feel the threads at all. The IUD may come out by itself. You may become pregnant if the device comes out. If you notice that the IUD has come out use a backup birth control method like condoms and call your health care provider. Using tampons will not change the position of the IUD and are okay to use during your period. What side effects may I notice from receiving this medicine?   Side effects that you should report to your doctor or health care professional as soon as possible: -allergic reactions like skin rash, itching or  hives, swelling of the face, lips, or tongue -fever, flu-like symptoms -genital sores -high blood pressure -no menstrual period for 6 weeks during use -pain, swelling, warmth in the leg -pelvic pain or tenderness -severe or sudden headache -signs of pregnancy -stomach cramping -sudden shortness of breath -trouble with balance, talking, or walking -unusual vaginal bleeding, discharge -yellowing of the eyes or skin Side effects that usually do not require medical attention (report to your doctor or health care professional if they continue or are bothersome): -acne -breast pain -change in sex drive or performance -changes in weight -cramping, dizziness, or faintness while the device is being inserted -headache -irregular menstrual bleeding within first 3 to 6 months of use -nausea This list may not describe all possible side effects. Call your doctor for medical advice about side effects. You may report side effects to FDA at 1-800-FDA-1088. Where should I keep my medicine? This does not apply. NOTE: This sheet is a summary. It may not cover all possible information. If you have questions about this medicine, talk to your doctor, pharmacist, or health care provider.    2016, Elsevier/Gold Standard. (2011-09-20 13:54:04)  

## 2016-06-20 NOTE — Addendum Note (Signed)
Addended by: Faythe CasaBELLAMY, Dionicio Shelnutt M on: 06/20/2016 04:14 PM   Modules accepted: Orders

## 2016-07-18 ENCOUNTER — Ambulatory Visit: Payer: BC Managed Care – PPO | Admitting: Family Medicine

## 2016-08-09 ENCOUNTER — Ambulatory Visit: Payer: BC Managed Care – PPO | Admitting: Family Medicine

## 2017-01-08 IMAGING — US US MFM OB FOLLOW-UP
1 series · 14 of 28 positions shown · non-contrast
Comparison: none

[Series 1: us mfm ob follow-up · 55 acquisitions, 14 frames shown]
[im 3/55]
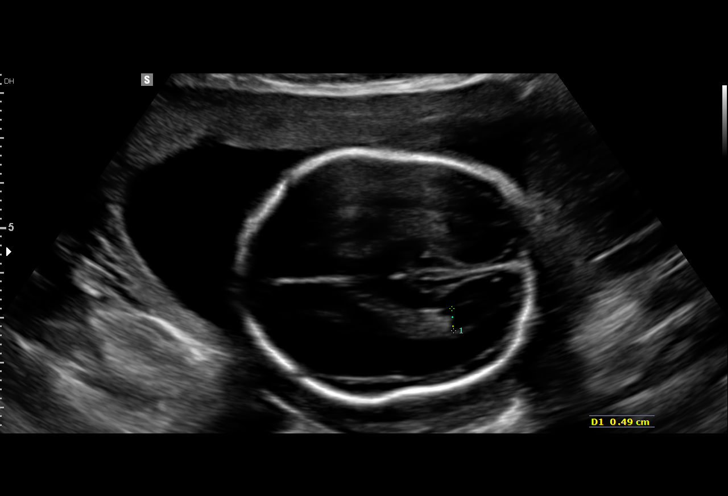
[im 7/55]
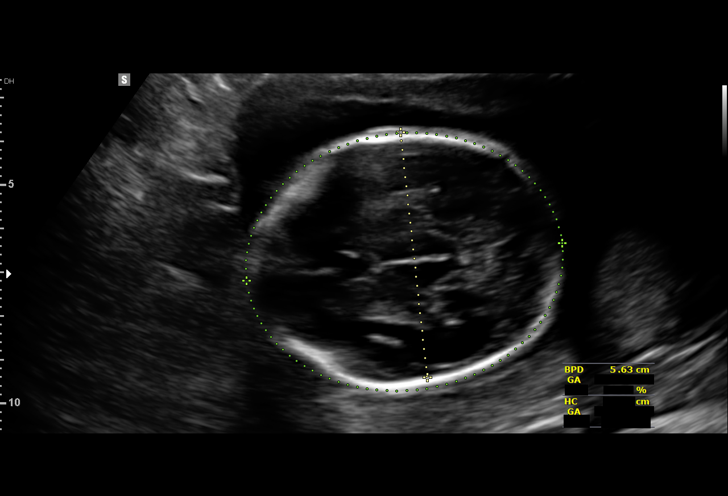
[im 11/55]
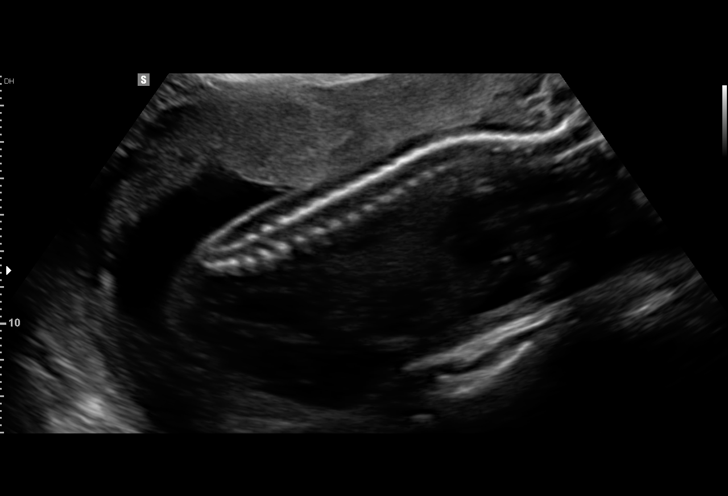
[im 15/55]
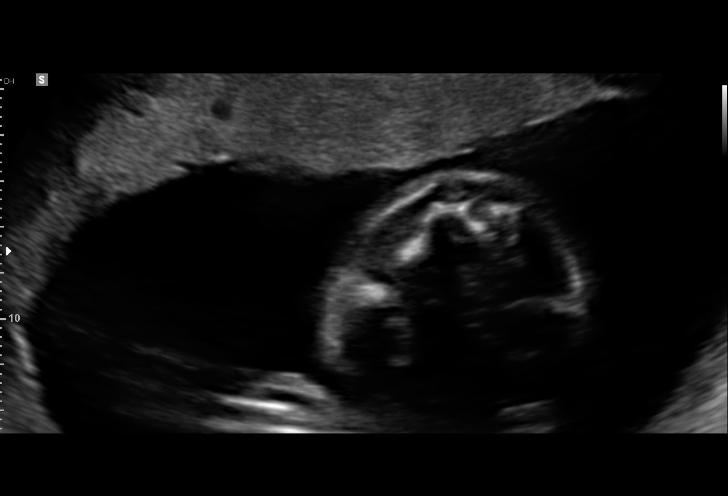
[im 19/55]
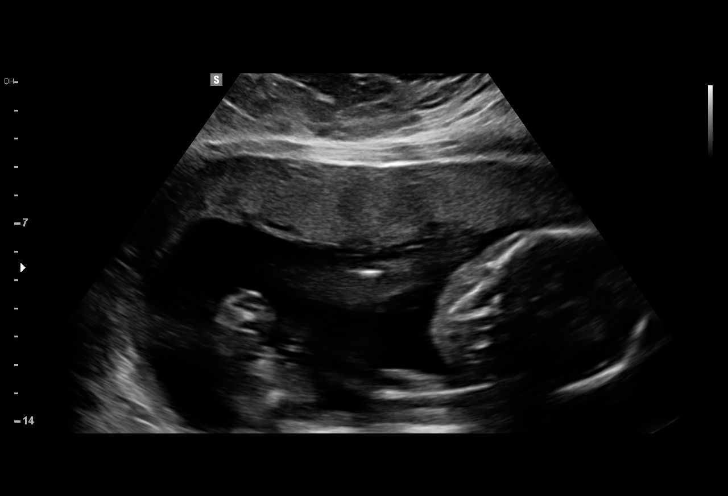
[im 23/55]
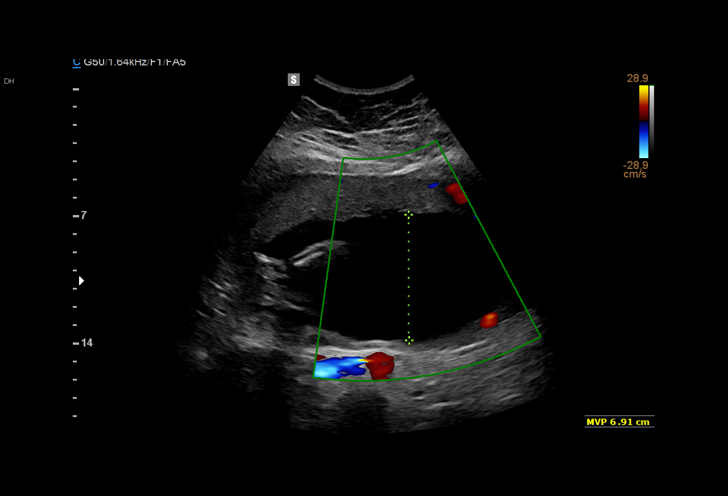
[im 27/55]
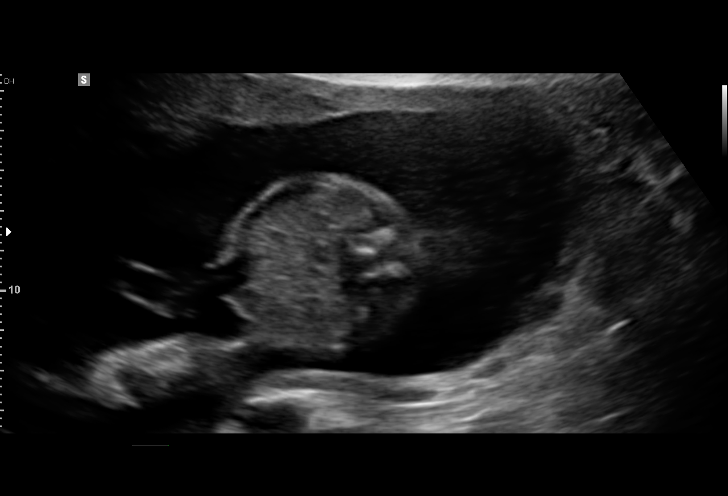
[im 31/55]
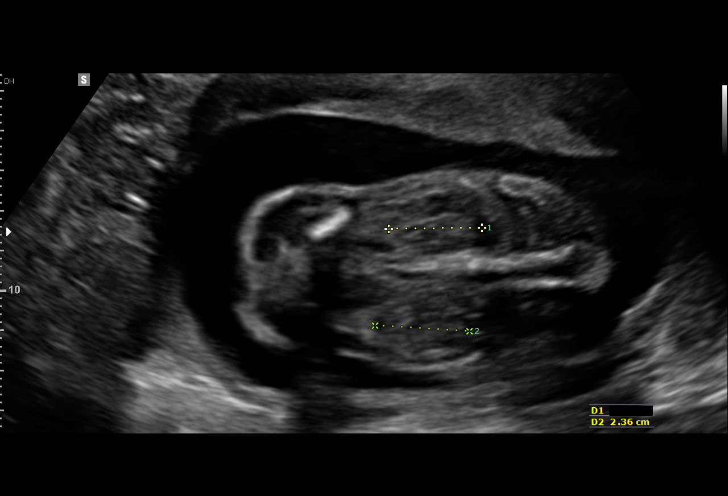
[im 35/55]
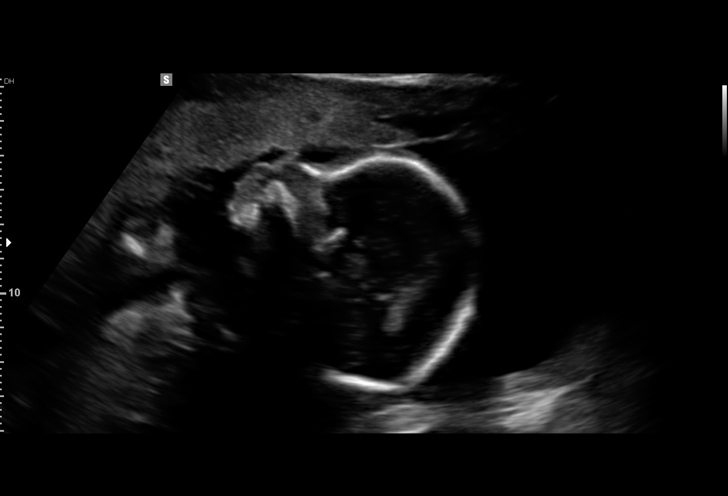
[im 39/55]
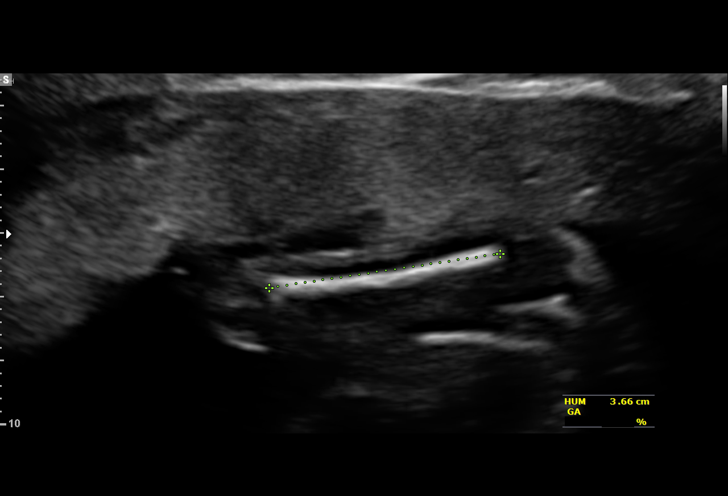
[im 43/55]
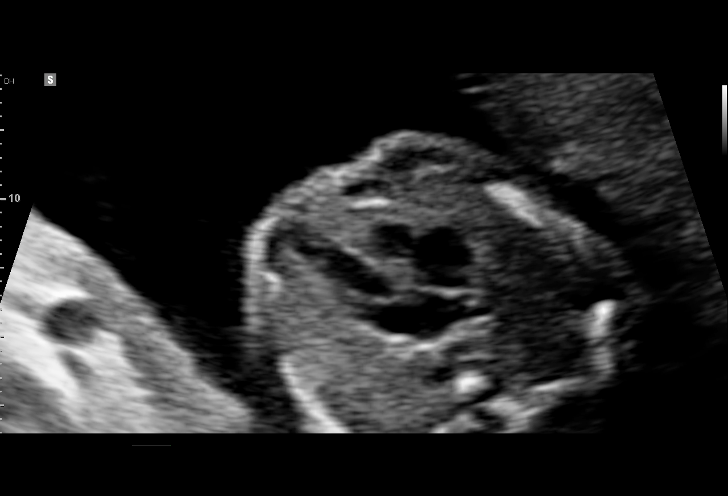
[im 47/55]
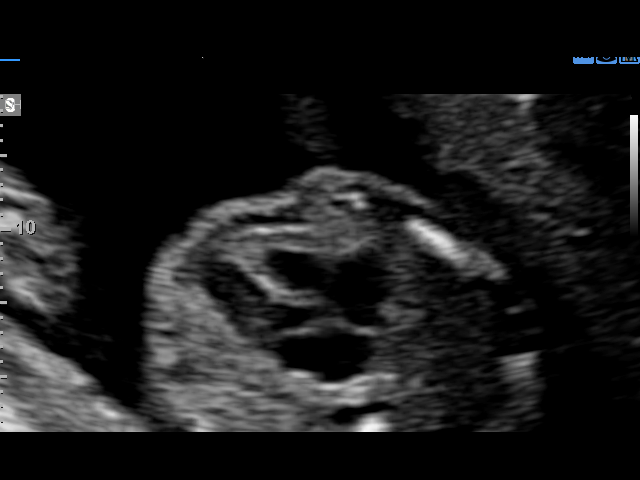
[im 51/55]
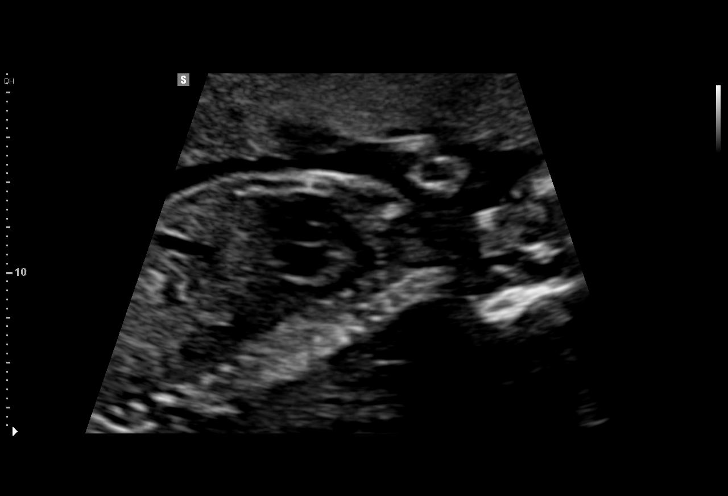
[im 55/55]
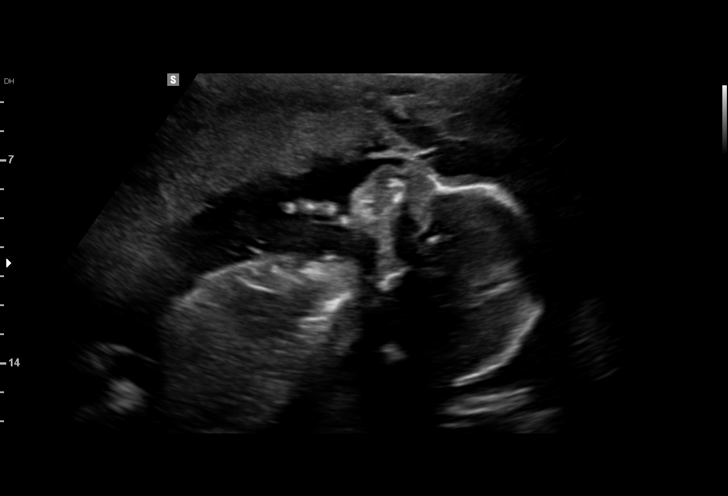

[14 of 28 positions shown; findings below may reference images not displayed]

Hospital Clinic-
Faculty Physician
OB/Gyn Clinic

1  SOYOUN DIEGOMESI            [id number]      [id number]     [id number]
Indications

22 weeks gestation of pregnancy
Follow-up incomplete fetal anatomic            Z36
evaluation
Obesity complicating pregnancy, second
trimester
OB History

Blood Type:            Height:  5'3"   Weight (lb):  273      BMI:
Gravidity:    1         Term:   0        Prem:   0        SAB:   0
TOP:          0       Ectopic:  0        Living: 0
Fetal Evaluation

Num Of Fetuses:     1
Fetal Heart         146
Rate(bpm):
Cardiac Activity:   Observed
Presentation:       Cephalic
Placenta:           Anterior, above cervical os
P. Cord Insertion:  Previously Visualized

Amniotic Fluid
AFI FV:      Subjectively within normal limits
Largest Pocket(cm)
6.9
Biometry

BPD:      56.2  mm     G. Age:  23w 1d         69  %    CI:         72.7   %   70 - 86
FL/HC:      19.3   %   19.2 -
HC:      209.6  mm     G. Age:  23w 0d         56  %    HC/AC:      1.11       1.05 -
AC:      188.7  mm     G. Age:  23w 5d         75  %    FL/BPD:     72.1   %   71 - 87
FL:       40.5  mm     G. Age:  23w 1d         57  %    FL/AC:      21.5   %   20 - 24
HUM:      36.4  mm     G. Age:  22w 5d         47  %
CER:      24.2  mm     G. Age:  22w 1d         44  %
CM:        4.6  mm

Est. FW:     586  gm      1 lb 5 oz     62  %
Gestational Age

LMP:           22w 4d       Date:   08/06/15                 EDD:   05/12/16
U/S Today:     23w 2d                                        EDD:   05/07/16
Best:          22w 4d    Det. By:   LMP  (08/06/15)          EDD:   05/12/16
Anatomy

Cranium:               Appears normal         Aortic Arch:            Appears normal
Cavum:                 Appears normal         Ductal Arch:            Appears normal
Ventricles:            Appears normal         Diaphragm:              Appears normal
Choroid Plexus:        Previously seen        Stomach:                Appears normal, left
sided
Cerebellum:            Appears normal         Abdomen:                Appears normal
Posterior Fossa:       Appears normal         Abdominal Wall:         Previously seen
Nuchal Fold:           Previously seen        Cord Vessels:           Previously seen
Face:                  Orbits and profile     Kidneys:                Appear normal
previously seen
Lips:                  Appears normal         Bladder:                Appears normal
Thoracic:              Appears normal         Spine:                  Appears normal
Heart:                 Appears normal         Upper Extremities:      Previously seen
(4CH, axis, and
situs)
RVOT:                  Previously seen        Lower Extremities:      Previously seen
LVOT:                  Previously seen

Other:  Fetus appears to be a male. Nasal bone previously visualized. Heels
previously visualized. 5th digit visualized. Technically difficult due to
maternal habitus and fetal position.
Cervix Uterus Adnexa

Cervix
Length:            4.1  cm.
Normal appearance by transabdominal scan.

Adnexa:       No abnormality visualized.
Impression

Single IUP at 22w 4d
Normal interval anatomy
Fetal growth is appropriate (62nd %tile)
Anterior placenta without previa
Normal amniotic fluid volume
Recommendations

Follow-up ultrasounds as clinically indicated.

## 2017-12-03 ENCOUNTER — Encounter: Payer: Self-pay | Admitting: *Deleted

## 2022-02-17 ENCOUNTER — Emergency Department (HOSPITAL_COMMUNITY): Payer: BC Managed Care – PPO

## 2022-02-17 ENCOUNTER — Emergency Department (HOSPITAL_COMMUNITY)
Admission: EM | Admit: 2022-02-17 | Discharge: 2022-02-17 | Disposition: A | Discharge status: Home / Self Care | Payer: Self-pay | Attending: Emergency Medicine | Admitting: Emergency Medicine

## 2022-02-17 DIAGNOSIS — M25572 Pain in left ankle and joints of left foot: Secondary | ICD-10-CM | POA: Insufficient documentation

## 2022-02-17 DIAGNOSIS — S93402A Sprain of unspecified ligament of left ankle, initial encounter: Secondary | ICD-10-CM | POA: Insufficient documentation

## 2022-02-17 DIAGNOSIS — W010XXA Fall on same level from slipping, tripping and stumbling without subsequent striking against object, initial encounter: Secondary | ICD-10-CM | POA: Insufficient documentation

## 2022-02-17 DIAGNOSIS — Y92480 Sidewalk as the place of occurrence of the external cause: Secondary | ICD-10-CM | POA: Insufficient documentation

## 2022-02-17 MED ORDER — NAPROXEN 500 MG PO TABS: 500.0000 mg | ORAL_TABLET | Freq: Two times a day (BID) | ORAL | 0 refills | Status: DC

## 2022-02-17 MED ORDER — IBUPROFEN 800 MG PO TABS
800.0000 mg | ORAL_TABLET | Freq: Once | ORAL | Status: AC
  Administered 2022-02-17: 800 mg via ORAL
  Filled 2022-02-17: qty 1

## 2022-02-17 NOTE — ED Provider Notes (Signed)
[facility] EMERGENCY DEPARTMENT Provider Note   CSN: [id number] Arrival date & time: 02/17/22  1734     History  Chief Complaint  Patient presents with   Fall   Ankle Pain    L    Joann Pearson is a 31 y.o. female.  Left ankle pain after falling on the sidewalk on June 13.  States she tripped over an uneven spot in the cement and rolled her left foot.  Has pain laterally.  Did not hit her head.  Landed on her right knee and foot but that does not hurt.  Having pain to her left lateral ankle despite elevation, Epsom salts and soaking at home.  No blood thinner use.  No head, neck, back, chest or abdominal pain. No blood thinner use.  No focal weakness, numbness or tingling  The history is provided by the patient.  Fall Pertinent negatives include no chest pain, no abdominal pain and no headaches.  Ankle Pain Associated symptoms: no fever        Home Medications Prior to Admission medications   Medication Sig Start Date End Date Taking? Authorizing Provider  Docosahexaenoic Acid (DHA PO) Take 1 tablet by mouth daily. Reported on 02/22/2016    [provider]  Prenatal Vit-Fe Fumarate-FA (MULTIVITAMIN-PRENATAL) 27-0.8 MG TABS tablet Take 1 tablet by mouth daily at 12 noon.    [provider]      Allergies    Orange concentrate [flavoring agent] and Orange fruit [citrus]    Review of Systems   Review of Systems  Constitutional:  Negative for activity change, appetite change and fever.  HENT:  Negative for congestion.   Respiratory:  Negative for chest tightness.   Cardiovascular:  Negative for chest pain.  Gastrointestinal:  Negative for abdominal pain, nausea and vomiting.  Musculoskeletal:  Positive for arthralgias and myalgias.  Skin:  Negative for rash.  Neurological:  Negative for dizziness, weakness and headaches.   all other systems are negative except as noted in the HPI and PMH.    Physical Exam Updated Vital Signs BP (!)  134/97   Pulse 69   Temp 98.2 F (36.8 C) (Oral)   Resp 18   SpO2 100%  Physical Exam Vitals and nursing note reviewed.  Constitutional:      General: She is not in acute distress.    Appearance: She is well-developed.  HENT:     Head: Normocephalic and atraumatic.     Mouth/Throat:     Pharynx: No oropharyngeal exudate.  Eyes:     Conjunctiva/sclera: Conjunctivae normal.     Pupils: Pupils are equal, round, and reactive to light.  Neck:     Comments: No meningismus. Cardiovascular:     Rate and Rhythm: Normal rate and regular rhythm.     Heart sounds: Normal heart sounds. No murmur heard. Pulmonary:     Effort: Pulmonary effort is normal. No respiratory distress.     Breath sounds: Normal breath sounds.  Abdominal:     Palpations: Abdomen is soft.     Tenderness: There is no abdominal tenderness. There is no guarding or rebound.  Musculoskeletal:        General: Swelling and tenderness present. Normal range of motion.     Cervical back: Normal range of motion and neck supple.     Comments: Left lateral ankle tenderness, no deformity.  Intact DP PT pulses.  Compartments soft, Achilles tendon intact. No pain at base of fifth metatarsal, no proximal  fibular pain  Skin:    General: Skin is warm.  Neurological:     Mental Status: She is alert and oriented to person, place, and time.     Cranial Nerves: No cranial nerve deficit.     Motor: No abnormal muscle tone.     Coordination: Coordination normal.     Comments:  5/5 strength throughout. CN 2-12 intact.Equal grip strength.   Psychiatric:        Behavior: Behavior normal.     ED Results / Procedures / Treatments   Labs (all labs ordered are listed, but only abnormal results are displayed) Labs Reviewed - No data to display  EKG None  Radiology DG Ankle Complete Left  Result Date: 02/17/2022 CLINICAL DATA:  Acute LEFT ankle pain and swelling following fall. Initial encounter. EXAM: LEFT ANKLE COMPLETE - 3+ VIEW  COMPARISON:  None Available. FINDINGS: There is no evidence of fracture, dislocation, or joint effusion. Soft tissue swelling is noted. No focal bony lesions are present. IMPRESSION: Soft tissue swelling without acute bony abnormality. Electronically Signed   By: Harmon Pier M.D.   On: 02/17/2022 18:17    Procedures Procedures    Medications Ordered in ED Medications  ibuprofen (ADVIL) tablet 800 mg (has no administration in time range)    ED Course/ Medical Decision Making/ A&P                           Medical Decision Making Amount and/or Complexity of Data Reviewed Labs:  Decision-making details documented in ED Course. Radiology: ordered and independent interpretation performed. Decision-making details documented in ED Course. ECG/medicine tests: ordered and independent interpretation performed. Decision-making details documented in ED Course.  Risk Prescription drug management.   Fall with left ankle pain.  Neurovascular intact.  No head injury.  Intact distal pulses.  X-rays negative for fracture or dislocation.  Results reviewed and interpreted by me.  ASO brace, crutches, ice, elevation, NSAIDs, PCP follow-up        Final Clinical Impression(s) / ED Diagnoses Final diagnoses:  Sprain of left ankle, unspecified ligament, initial encounter    Rx / DC Orders ED Discharge Orders     None         Koki Buxton, Jeannett Senior, MD 02/17/22 1914

## 2022-02-17 NOTE — ED Triage Notes (Signed)
Pt c/o L ankle pain after fall on Tuesday, ambulatory since, brace placed, ice & rest. CNS intact distal to injury

## 2022-02-17 NOTE — ED Notes (Signed)
E-signature pad unavailable at time of pt discharge. This RN discussed discharge materials with pt and answered all pt questions. Pt stated understanding of discharge material. ? ?

## 2022-02-17 NOTE — Discharge Instructions (Signed)
Take the anti-inflammatories as prescribed, use ice, elevation and bear weight as tolerated.  Follow-up with your primary doctor or the orthopedic doctor.  Return to the ED worsening pain, numbness, tingling, any other concerns

## 2022-06-27 ENCOUNTER — Telehealth: Payer: Self-pay | Admitting: Nurse Practitioner

## 2022-06-27 DIAGNOSIS — K591 Functional diarrhea: Secondary | ICD-10-CM

## 2022-06-27 NOTE — Progress Notes (Signed)
Virtual Visit Consent   Joann Pearson, you are scheduled for a virtual visit with Mary-Margaret Daphine Deutscher, FNP, a [facility] provider, today.     Just as with appointments in the office, your consent must be obtained to participate.  Your consent will be active for this visit and any virtual visit you may have with one of our providers in the next 365 days.     If you have a MyChart account, a copy of this consent can be sent to you electronically.  All virtual visits are billed to your insurance company just like a traditional visit in the office.    As this is a virtual visit, video technology does not allow for your provider to perform a traditional examination.  This may limit your provider's ability to fully assess your condition.  If your provider identifies any concerns that need to be evaluated in person or the need to arrange testing (such as labs, EKG, etc.), we will make arrangements to do so.     Although advances in technology are sophisticated, we cannot ensure that it will always work on either your end or our end.  If the connection with a video visit is poor, the visit may have to be switched to a telephone visit.  With either a video or telephone visit, we are not always able to ensure that we have a secure connection.     I need to obtain your verbal consent now.   Are you willing to proceed with your visit today? YES   Joann Pearson has provided verbal consent on 06/27/2022 for a virtual visit (video or telephone).   Mary-Margaret Daphine Deutscher, FNP   Date: 06/27/2022 1:37 PM   Virtual Visit via Video Note   I, Mary-Margaret Jamelah Sitzer, connected with Joann Pearson ([id number], 1990/09/21) on 06/27/22 at  1:45 PM EDT by a video-enabled telemedicine application and verified that I am speaking with the correct person using two identifiers.  Location: Patient: Virtual Visit Location Patient: Home Provider: Virtual Visit Location Provider: Mobile   I discussed the limitations of evaluation  and management by telemedicine and the availability of in person appointments. The patient expressed understanding and agreed to proceed.    History of Present Illness: Joann Pearson is a 31 y.o. who identifies as a female who was assigned female at birth, and is being seen today diarrhea  .  HPI: Patient has had diarrhea for 3 days. Better today and needs to go back to work. Cannot go back to work without a note stating she is bettter.     Review of Systems  Constitutional:  Negative for chills and fever.  Gastrointestinal:  Positive for diarrhea (not since this morning early). Negative for abdominal pain, blood in stool, nausea and vomiting.    Problems:  Patient Active Problem List   Diagnosis Date Noted   Obesity 11/02/2015    Allergies:  Allergies  Allergen Reactions   Orange Concentrate [Flavoring Agent] Hives   Orange Fruit [Citrus] Hives   Medications:  Current Outpatient Medications:    Docosahexaenoic Acid (DHA PO), Take 1 tablet by mouth daily. Reported on 02/22/2016, Disp: , Rfl:    naproxen (NAPROSYN) 500 MG tablet, Take 1 tablet (500 mg total) by mouth in the morning and at bedtime., Disp: 30 tablet, Rfl: 0   Prenatal Vit-Fe Fumarate-FA (MULTIVITAMIN-PRENATAL) 27-0.8 MG TABS tablet, Take 1 tablet by mouth daily at 12 noon., Disp: , Rfl:   Observations/Objective: Patient is well-developed, well-nourished in no acute  distress.  Resting comfortably  at home.  Head is normocephalic, atraumatic.  No labored breathing.  Speech is clear and coherent with logical content.  Patient is alert and oriented at baseline.    Assessment and Plan:  Joann Pearson in today with chief complaint of Diarrhea   1. Functional diarrhea Has resolved Force fluids    Follow Up Instructions: I discussed the assessment and treatment plan with the patient. The patient was provided an opportunity to ask questions and all were answered. The patient agreed with the plan and demonstrated an  understanding of the instructions.  A copy of instructions were sent to the patient via MyChart.  The patient was advised to call back or seek an in-person evaluation if the symptoms worsen or if the condition fails to improve as anticipated.  Time:  I spent 6 minutes with the patient via telehealth technology discussing the above problems/concerns.    Mary-Margaret Hassell Done, FNP

## 2022-06-27 NOTE — Patient Instructions (Signed)
  Joann Pearson, thank you for joining Chevis Pretty, FNP for today's virtual visit.  While this provider is not your primary care provider (PCP), if your PCP is located in our provider database this encounter information will be shared with them immediately following your visit.   [facility] account gives you access to today's visit and all your visits, tests, and labs performed at [facility] " click here if you don't have a [facility] account or go to [email or web address]  Consent: (Patient) Joann Pearson provided verbal consent for this virtual visit at the beginning of the encounter.  Current Medications:  Current Outpatient Medications:    Docosahexaenoic Acid (DHA PO), Take 1 tablet by mouth daily. Reported on 02/22/2016, Disp: , Rfl:    naproxen (NAPROSYN) 500 MG tablet, Take 1 tablet (500 mg total) by mouth in the morning and at bedtime., Disp: 30 tablet, Rfl: 0   Prenatal Vit-Fe Fumarate-FA (MULTIVITAMIN-PRENATAL) 27-0.8 MG TABS tablet, Take 1 tablet by mouth daily at 12 noon., Disp: , Rfl:    Medications ordered in this encounter:  No orders of the defined types were placed in this encounter.    *If you need refills on other medications prior to your next appointment, please contact your pharmacy*  Follow-Up: Call back or seek an in-person evaluation if the symptoms worsen or if the condition fails to improve as anticipated.  [place] [phone]  Other Instructions    If you have been instructed to have an in-person evaluation today at a local Urgent Care facility, please use the link below. It will take you to a list of all of our available [facility], including address, phone number and hours of operation. Please do not delay care.  [facility]  If you or a family member do not have a primary care provider, use the link below to schedule a visit and establish care. When you choose a [facility] primary care physician or advanced practice provider, you gain a long-term partner in health. Find a Primary Care Provider  Learn more about [place] [facility]'s in-office and virtual care options: [facility]

## 2023-02-21 ENCOUNTER — Emergency Department (HOSPITAL_COMMUNITY): Payer: 59

## 2023-02-21 ENCOUNTER — Encounter (HOSPITAL_COMMUNITY): Payer: Self-pay

## 2023-02-21 ENCOUNTER — Emergency Department (HOSPITAL_COMMUNITY)
Admission: EM | Admit: 2023-02-21 | Discharge: 2023-02-21 | Disposition: A | Discharge status: Home / Self Care | Payer: 59 | Attending: Emergency Medicine | Admitting: Emergency Medicine

## 2023-02-21 DIAGNOSIS — R000 Tachycardia, unspecified: Secondary | ICD-10-CM | POA: Diagnosis not present

## 2023-02-21 DIAGNOSIS — T189XXA Foreign body of alimentary tract, part unspecified, initial encounter: Secondary | ICD-10-CM | POA: Insufficient documentation

## 2023-02-21 DIAGNOSIS — R1310 Dysphagia, unspecified: Secondary | ICD-10-CM | POA: Diagnosis not present

## 2023-02-21 DIAGNOSIS — E039 Hypothyroidism, unspecified: Secondary | ICD-10-CM | POA: Diagnosis not present

## 2023-02-21 DIAGNOSIS — S025XXA Fracture of tooth (traumatic), initial encounter for closed fracture: Secondary | ICD-10-CM | POA: Diagnosis not present

## 2023-02-21 DIAGNOSIS — J398 Other specified diseases of upper respiratory tract: Secondary | ICD-10-CM | POA: Diagnosis not present

## 2023-02-21 DIAGNOSIS — X58XXXA Exposure to other specified factors, initial encounter: Secondary | ICD-10-CM | POA: Insufficient documentation

## 2023-02-21 DIAGNOSIS — R070 Pain in throat: Secondary | ICD-10-CM | POA: Diagnosis not present

## 2023-02-21 DIAGNOSIS — S0993XA Unspecified injury of face, initial encounter: Secondary | ICD-10-CM | POA: Diagnosis not present

## 2023-02-21 HISTORY — DX: Hypothyroidism, unspecified: E03.9

## 2023-02-21 HISTORY — DX: Anxiety disorder, unspecified: F41.9

## 2023-02-21 HISTORY — DX: Acquired absence of spleen: Z90.81

## 2023-02-21 HISTORY — DX: Depression, unspecified: F32.A

## 2023-02-21 NOTE — ED Notes (Signed)
Triage completed by Lady Gary , RN

## 2023-02-21 NOTE — ED Triage Notes (Signed)
Reports she swallowed chipped tooth today. Pt reports, "I feel like it's stuck in my throat." Airway intact. No drooling. No change in voice quality. O2 100% on RA.

## 2023-02-21 NOTE — ED Provider Notes (Signed)
[facility] Emergency Department Provider Note MRN:  [id number]  Arrival date & time: 02/21/23     Chief Complaint   Swallowed Foreign Body   History of Present Illness   Joann Pearson is a 32 y.o. year-old female with a history of hypothyroidism, thyroid goiter presenting to the ED with chief complaint of swallowed foreign body.  Chipped her back right bottom tooth and felt to go down her throat.  Felt like it was stuck in her throat for a while.  Since then it feels like it is making its way down into her chest/stomach.  Feels a lot better at this time.  Review of Systems  A thorough review of systems was obtained and all systems are negative except as noted in the HPI and PMH.   Patient's Health History    Past Medical History:  Diagnosis Date   Anxiety    Cesarean delivery delivered    Depression    H/O splenectomy    Hypothyroidism    Medical history non-contributory     Past Surgical History:  Procedure Laterality Date   CESAREAN SECTION N/A 05/21/2016   Procedure: CESAREAN SECTION;  Surgeon: Catalina Antigua, MD;  Location: [facility];  Service: Obstetrics;  Laterality: N/A;   SPLENECTOMY      History reviewed. No pertinent family history.  Social History   Socioeconomic History   Marital status: Single    Spouse name: Not on file   Number of children: Not on file   Years of education: Not on file   Highest education level: Not on file  Occupational History   Not on file  Tobacco Use   Smoking status: Never   Smokeless tobacco: Never  Vaping Use   Vaping Use: Never used  Substance and Sexual Activity   Alcohol use: No   Drug use: Yes    Types: Marijuana   Sexual activity: Not on file  Other Topics Concern   Not on file  Social History Narrative   Not on file   Social Determinants of Health   Financial Resource Strain: Not on file  Food Insecurity: Not on file  Transportation Needs: Not on file  Physical Activity: Not  on file  Stress: Not on file  Social Connections: Not on file  Intimate Partner Violence: Not on file     Physical Exam   Vitals:   02/21/23 1847 02/21/23 2120  BP: 124/70 (!) 109/56  Pulse: (!) 122 64  Resp: 20 16  Temp: 99.2 F (37.3 C) 98.8 F (37.1 C)  SpO2: 96% 100%    CONSTITUTIONAL: Well-appearing, NAD NEURO/PSYCH:  Alert and oriented x 3, no focal deficits EYES:  eyes equal and reactive ENT/NECK:  no LAD, no JVD, large thyroid goiter CARDIO: Regular rate, well-perfused, normal S1 and S2 PULM:  CTAB no wheezing or rhonchi GI/GU:  non-distended, non-tender MSK/SPINE:  No gross deformities, no edema SKIN:  no rash, atraumatic   *Additional and/or pertinent findings included in MDM below  Diagnostic and Interventional Summary    EKG Interpretation  Date/Time:    Ventricular Rate:    PR Interval:    QRS Duration:   QT Interval:    QTC Calculation:   R Axis:     Text Interpretation:         Labs Reviewed - No data to display  DG Neck Soft Tissue  Final Result    DG Chest 2 View  Final Result      Medications -  No data to display   Procedures  /  Critical Care Procedures  ED Course and Medical Decision Making  Initial Impression and Ddx Question foreign body in the esophagus or trachea or lungs.  Or simply foreign body sensation from abrasion on the way down.  Past medical/surgical history that increases complexity of ED encounter: Hypothyroidism with known goiter  Interpretation of Diagnostics I personally reviewed the Chest Xray and my interpretation is as follows: Normal, no foreign body  Neck also reassuring.  Patient Reassessment and Ultimate Disposition/Management     With patient's well-appearing nature, normal vitals, no acute distress and normal x-rays patient is appropriate for discharge with reassurance.  The tooth in question is the right maxillary wisdom tooth which crumbled rather than chipped likely due to underlying caries,  recommend history follow-up  Patient management required discussion with the following services or consulting groups:  None  Complexity of Problems Addressed Acute illness or injury that poses threat of life of bodily function  Additional Data Reviewed and Analyzed Further history obtained from: None  Additional Factors Impacting ED Encounter Risk None  Elmer Sow. Pilar Plate, MD [facility] Emergency Medicine [facility] [email or web address]  Final Clinical Impressions(s) / ED Diagnoses     ICD-10-CM   1. Swallowed foreign body, initial encounter  T18.9XXA     2. Closed fracture of tooth, initial encounter  [email or web address]       ED Discharge Orders     None        Discharge Instructions Discussed with and Provided to Patient:    Discharge Instructions      You were evaluated in the Emergency Department and after careful evaluation, we did not find any emergent condition requiring admission or further testing in the hospital.  Your exam/testing today was overall reassuring.  X-rays show that your chipped tooth is not stuck in your throat or your lungs.  Should pass through your system without issue.  Use Tylenol or Motrin for any lingering discomfort.  Recommend follow-up with a dentist.  Please return to the Emergency Department if you experience any worsening of your condition.  Thank you for allowing [place] to be a part of your care.       Sabas Sous, MD 02/21/23 [phone]

## 2023-02-21 NOTE — ED Notes (Signed)
Patient eating and drinking with no difficulty prior to discharge

## 2023-02-21 NOTE — ED Provider Triage Note (Signed)
Emergency Medicine Provider Triage Evaluation Note  Kiani Wurtzel , a 32 y.o. female  was evaluated in triage.  Pt complains of foreign body sensation in throat.  Review of Systems  Positive: Throat discomfort Negative: Shortness of breath  Physical Exam  BP 124/70 (BP Location: Right Arm)   Pulse (!) 122   Temp 99.2 F (37.3 C) (Oral)   Resp 20   Ht 5\' 3"  (1.6 m)   Wt 124.7 kg   SpO2 96%   BMI 48.71 kg/m  Gen:   Awake, no distress   Resp:  Normal effort  MSK:   Moves extremities without difficulty  Other:    Medical Decision Making  Medically screening exam initiated at 8:07 PM.  Appropriate orders placed.  Ronnika Gettinger was informed that the remainder of the evaluation will be completed by another provider, this initial triage assessment does not replace that evaluation, and the importance of remaining in the ED until their evaluation is complete.     Terrilee Files, MD 02/22/23 [phone]

## 2023-02-21 NOTE — ED Notes (Signed)
Patient ambulatory to room with steady gait c/o sore throat after stating she swallowed a chipped tooth today. Patient a/o x 4 respirations even and non labored no airway issues texting on cell phone and requesting food and a blanket.

## 2023-02-21 NOTE — Discharge Instructions (Addendum)
You were evaluated in the Emergency Department and after careful evaluation, we did not find any emergent condition requiring admission or further testing in the hospital.  Your exam/testing today was overall reassuring.  X-rays show that your chipped tooth is not stuck in your throat or your lungs.  Should pass through your system without issue.  Use Tylenol or Motrin for any lingering discomfort.  Recommend follow-up with a dentist.  Please return to the Emergency Department if you experience any worsening of your condition.  Thank you for allowing [place] to be a part of your care.

## 2023-03-19 DIAGNOSIS — J029 Acute pharyngitis, unspecified: Secondary | ICD-10-CM | POA: Diagnosis not present

## 2023-03-19 DIAGNOSIS — J069 Acute upper respiratory infection, unspecified: Secondary | ICD-10-CM | POA: Diagnosis not present

## 2023-04-15 DIAGNOSIS — J029 Acute pharyngitis, unspecified: Secondary | ICD-10-CM | POA: Diagnosis not present

## 2023-07-18 ENCOUNTER — Emergency Department (HOSPITAL_COMMUNITY)
Admission: EM | Admit: 2023-07-18 | Discharge: 2023-07-18 | Disposition: A | Discharge status: Home / Self Care | Payer: 59 | Attending: Emergency Medicine | Admitting: Emergency Medicine

## 2023-07-18 ENCOUNTER — Other Ambulatory Visit: Payer: Self-pay

## 2023-07-18 ENCOUNTER — Emergency Department (HOSPITAL_COMMUNITY): Payer: 59

## 2023-07-18 ENCOUNTER — Encounter (HOSPITAL_COMMUNITY): Payer: Self-pay

## 2023-07-18 DIAGNOSIS — N939 Abnormal uterine and vaginal bleeding, unspecified: Secondary | ICD-10-CM | POA: Insufficient documentation

## 2023-07-18 DIAGNOSIS — R1030 Lower abdominal pain, unspecified: Secondary | ICD-10-CM | POA: Insufficient documentation

## 2023-07-18 DIAGNOSIS — R102 Pelvic and perineal pain: Secondary | ICD-10-CM | POA: Diagnosis not present

## 2023-07-18 DIAGNOSIS — M549 Dorsalgia, unspecified: Secondary | ICD-10-CM | POA: Diagnosis not present

## 2023-07-18 LAB — CBC
HCT: 38.3 % (ref 36.0–46.0)
Hemoglobin: 12.5 g/dL (ref 12.0–15.0)
MCH: 30.9 pg (ref 26.0–34.0)
MCHC: 32.6 g/dL (ref 30.0–36.0)
MCV: 94.8 fL (ref 80.0–100.0)
Platelets: 397 10*3/uL (ref 150–400)
RBC: 4.04 MIL/uL (ref 3.87–5.11)
RDW: 14.4 % (ref 11.5–15.5)
WBC: 4.8 10*3/uL (ref 4.0–10.5)
nRBC: 0 % (ref 0.0–0.2)

## 2023-07-18 LAB — HCG, SERUM, QUALITATIVE: Preg, Serum: NEGATIVE

## 2023-07-18 NOTE — Discharge Instructions (Signed)
You were seen for your vaginal bleeding in the emergency department.   At home, please take Tylenol and ibuprofen for any cramping that you have.    Check your MyChart online for the results of any tests that had not resulted by the time you left the emergency department.   Follow-up with your OB/GYN in 2-3 days regarding your visit.  Talk to them about if you need endometrial biopsy  Return immediately to the emergency department if you experience any of the following: Soaking through more than 2 pads per hour for 2 hours (4 pads total and 2 hours), or any other concerning symptoms.    Thank you for visiting our Emergency Department. It was a pleasure taking care of you today.

## 2023-07-18 NOTE — ED Provider Notes (Signed)
  Physical Exam  BP 126/69   Pulse 64   Temp 98.6 F (37 C)   Resp 16   Ht 5\' 2"  (1.575 m)   Wt 114.3 kg   LMP 07/18/2023   SpO2 100%   BMI 46.09 kg/m   Physical Exam  Procedures  Procedures  ED Course / MDM   Clinical Course as of 07/18/23 1619  Thu Jul 18, 2023  1613 Assumed care from Dr Anitra Lauth. [name] who presented with abnormal uterine bleeding x3 days that was heavy today. Minimal amount of bleeding on exam. Awaiting [place] results. Can fu with OBGYN.  [name]    Clinical Course User Index [name] Rondel Baton, MD   Medical Decision Making Amount and/or Complexity of Data Reviewed Labs: ordered.   Patient's ultrasound returned with thickened endometrium.  No other acute findings.  No significant bleeding since being in the emergency department.  Does smoke marijuana almost daily and has elevated BMI but no history of DVT or PE and no history of cancer.  Discussed the risks and benefits of OCPs with the patient's and given the minimal amount of bleeding we will hold off at this time.  Informed her of her thickened endometrium that was seen on ultrasound and the fact that she may need an endometrial biopsy at some point in time.  Will have her follow-up with her OB/GYN.  Return precautions discussed prior to discharge.   Rondel Baton, MD 07/18/23 [phone]

## 2023-07-18 NOTE — ED Notes (Signed)
EDP to bedside for pelvic exam, this RN to assist.

## 2023-07-18 NOTE — ED Triage Notes (Addendum)
Pt to ED c/o  vaginal bleeding x 1 day, heavier than normal. Reports changing tampon every 4 hours. Denies large clots. No dizziness, reports abdominal cramping and lower back pain. Has IUD. Reports irregular periods. Reports took pregnancy test yesterday and negative.

## 2023-07-18 NOTE — ED Notes (Signed)
Pt to US.

## 2023-07-18 NOTE — ED Provider Notes (Signed)
[place] EMERGENCY DEPARTMENT AT [facility] Provider Note   CSN: [id number] Arrival date & time: 07/18/23  1236     History  Chief Complaint  Patient presents with   Vaginal Bleeding    Joann Pearson is a 32 y.o. female.  Patient is a 32 year old female who is presenting today with recurrent vaginal bleeding.  Patient reports last month she had 2 menses and then she was 15 days into her cycle when this cycle started.  She initially reports light to moderate flow 2 days ago but then today her flow has been much heavier causing her to soak through her tampon and her close.  She has had typical lower abdominal cramping and some back pain but no significant change in pain similar to prior menses.  She takes no anticoagulation.  She does have an IUD that she thinks she has had for approximately 7 years.  She does get routine women's care through her PCP.  The history is provided by the patient.  Vaginal Bleeding      Home Medications Prior to Admission medications   Medication Sig Start Date End Date Taking? Authorizing Provider  Docosahexaenoic Acid (DHA PO) Take 1 tablet by mouth daily. Reported on 02/22/2016    [provider]  naproxen (NAPROSYN) 500 MG tablet Take 1 tablet (500 mg total) by mouth in the morning and at bedtime. 02/17/22   Rancour, Jeannett Senior, MD  Prenatal Vit-Fe Fumarate-FA (MULTIVITAMIN-PRENATAL) 27-0.8 MG TABS tablet Take 1 tablet by mouth daily at 12 noon.    [provider]      Allergies    Orange concentrate [flavoring agent] and Orange fruit [citrus]    Review of Systems   Review of Systems  Genitourinary:  Positive for vaginal bleeding.    Physical Exam Updated Vital Signs BP 126/69   Pulse 64   Temp 98.6 F (37 C)   Resp 16   Ht 5\' 2"  (1.575 m)   Wt 114.3 kg   LMP 07/18/2023   SpO2 100%   BMI 46.09 kg/m  Physical Exam Vitals and nursing note reviewed.  Constitutional:      General: She is not in acute  distress.    Appearance: She is well-developed.  HENT:     Head: Normocephalic and atraumatic.  Eyes:     Pupils: Pupils are equal, round, and reactive to light.  Cardiovascular:     Rate and Rhythm: Normal rate and regular rhythm.     Heart sounds: Normal heart sounds. No murmur heard.    No friction rub.  Pulmonary:     Effort: Pulmonary effort is normal.     Breath sounds: Normal breath sounds. No wheezing or rales.  Abdominal:     General: Bowel sounds are normal. There is no distension.     Palpations: Abdomen is soft.     Tenderness: There is no abdominal tenderness. There is no guarding or rebound.  Genitourinary:    Comments: Vaginal bleeding.  Unable to clearly visualize the cervix with the speculum as it is not long enough.  However palpated the cervix but was unable to palpate the strings of the IUD Musculoskeletal:        General: No tenderness. Normal range of motion.     Comments: No edema  Skin:    General: Skin is warm and dry.     Findings: No rash.  Neurological:     Mental Status: She is alert and oriented to person, place, and  time.     Cranial Nerves: No cranial nerve deficit.  Psychiatric:        Behavior: Behavior normal.     ED Results / Procedures / Treatments   Labs (all labs ordered are listed, but only abnormal results are displayed) Labs Reviewed  HCG, SERUM, QUALITATIVE  CBC    EKG None  Radiology No results found.  Procedures Procedures    Medications Ordered in ED Medications - No data to display  ED Course/ Medical Decision Making/ A&P Clinical Course as of 07/18/23 1631  Thu Jul 18, 2023  1613 Assumed care from Dr Anitra Lauth. [name] who presented with abnormal uterine bleeding x3 days that was heavy today. Minimal amount of bleeding on exam. Awaiting [place] results. Can fu with OBGYN.  [name]    Clinical Course User Index [name] Rondel Baton, MD                                 Medical Decision Making Amount and/or  Complexity of Data Reviewed Labs: ordered.   Pt  presenting today with a complaint that caries a high risk for morbidity and mortality.  Here today with complaints of dysfunctional uterine bleeding.  Also heavy flow today.  She has had bleeding for 3 days with this cycle but today has been the only day that is been heavy.  On exam patient does have vaginal bleeding but no significant clots present.  Unable to visualize her cervix well but with palpation unable to feel her IUD strings.  She has no abdominal pain at this time low suspicion for rupture of the IUD but possibility for fibroids or other cause for DU B.  Also concern for possible hormonal abnormalities causing frequent bleeding.  Patient last had Pap smear last year which was normal.  She takes no anticoagulation.  I independently interpreted patient's labs today CBC with stable Hb of 12.5 improved from 8 and pregnancy test is neg.  [place] to evaluate for above pending.         Final Clinical Impression(s) / ED Diagnoses Final diagnoses:  None    Rx / DC Orders ED Discharge Orders     None         Gwyneth Sprout, MD 07/18/23 [phone]

## 2023-07-25 DIAGNOSIS — E559 Vitamin D deficiency, unspecified: Secondary | ICD-10-CM | POA: Diagnosis not present

## 2023-07-25 DIAGNOSIS — Z1331 Encounter for screening for depression: Secondary | ICD-10-CM | POA: Diagnosis not present

## 2023-07-25 DIAGNOSIS — Z131 Encounter for screening for diabetes mellitus: Secondary | ICD-10-CM | POA: Diagnosis not present

## 2023-07-25 DIAGNOSIS — Z1329 Encounter for screening for other suspected endocrine disorder: Secondary | ICD-10-CM | POA: Diagnosis not present

## 2023-07-25 DIAGNOSIS — N939 Abnormal uterine and vaginal bleeding, unspecified: Secondary | ICD-10-CM | POA: Diagnosis not present

## 2023-07-25 DIAGNOSIS — Z113 Encounter for screening for infections with a predominantly sexual mode of transmission: Secondary | ICD-10-CM | POA: Diagnosis not present

## 2023-07-25 DIAGNOSIS — R030 Elevated blood-pressure reading, without diagnosis of hypertension: Secondary | ICD-10-CM | POA: Diagnosis not present

## 2023-07-25 DIAGNOSIS — R350 Frequency of micturition: Secondary | ICD-10-CM | POA: Diagnosis not present

## 2023-07-25 DIAGNOSIS — Z0000 Encounter for general adult medical examination without abnormal findings: Secondary | ICD-10-CM | POA: Diagnosis not present

## 2023-08-07 ENCOUNTER — Telehealth: Payer: Self-pay

## 2023-08-07 NOTE — Telephone Encounter (Signed)
Transition Care Management Unsuccessful Follow-up Telephone Call  Date of discharge and from where:  07/18/2023 [facility]  Attempts:  1st Attempt  Reason for unsuccessful TCM follow-up call:  Left voice message  Marty Sadlowski [facility]  [facility], [facility] Resource Care Guide Direct Dial: [phone]  Website: [email or web address]

## 2023-08-08 ENCOUNTER — Telehealth: Payer: Self-pay

## 2023-08-08 NOTE — Telephone Encounter (Signed)
Transition Care Management Unsuccessful Follow-up Telephone Call  Date of discharge and from where:  07/18/2023 [facility]  Attempts:  2nd Attempt  Reason for unsuccessful TCM follow-up call:  Left voice message  Ostin Mathey [facility]  [facility], [facility] Resource Care Guide Direct Dial: [phone]  Website: [email or web address]
